# Patient Record
Sex: Female | Born: 2000 | Race: Black or African American | Hispanic: No | Marital: Single | Smoking: Never smoker
Health system: Southern US, Community
[De-identification: ages and names within clinical notes are randomized; demographics above are authoritative.]

## PROBLEM LIST (undated history)

## (undated) DIAGNOSIS — J45909 Unspecified asthma, uncomplicated: Secondary | ICD-10-CM

## (undated) HISTORY — PX: HERNIA REPAIR: SHX51

---
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
## 2020-04-25 ENCOUNTER — Institutional Professional Consult (permissible substitution): Payer: Medicaid Other | Admitting: Plastic Surgery

## 2020-07-18 ENCOUNTER — Other Ambulatory Visit: Payer: Self-pay | Admitting: Nurse Practitioner

## 2020-09-20 ENCOUNTER — Ambulatory Visit
Admission: EM | Admit: 2020-09-20 | Discharge: 2020-09-20 | Disposition: A | Discharge status: Home / Self Care | Payer: Medicaid Other | Attending: Physician Assistant | Admitting: Physician Assistant

## 2020-09-20 ENCOUNTER — Other Ambulatory Visit: Payer: Self-pay

## 2020-09-20 DIAGNOSIS — R0981 Nasal congestion: Secondary | ICD-10-CM

## 2020-09-20 DIAGNOSIS — R059 Cough, unspecified: Secondary | ICD-10-CM

## 2020-09-20 MED ORDER — IPRATROPIUM BROMIDE 0.06 % NA SOLN: 2.0000 | Freq: Four times a day (QID) | NASAL | 0 refills | Status: DC

## 2020-09-20 MED ORDER — FLUTICASONE PROPIONATE 50 MCG/ACT NA SUSP: 2.0000 | Freq: Every day | NASAL | 0 refills | Status: DC

## 2020-09-20 MED ORDER — AMOXICILLIN-POT CLAVULANATE 875-125 MG PO TABS: 1.0000 | ORAL_TABLET | Freq: Two times a day (BID) | ORAL | 0 refills | Status: DC

## 2020-09-20 MED ORDER — LIDOCAINE VISCOUS HCL 2 % MT SOLN: OROMUCOSAL | 0 refills | Status: DC

## 2020-09-20 NOTE — ED Provider Notes (Signed)
[facility]    CSN: [id number] Arrival date & time: 09/20/20  1355      History   Chief Complaint Chief Complaint  Patient presents with  . Cough    x 1 week  . Headache    x 1 week  . Sore Throat    x 1 week    HPI ALERIA MAHEU is a 19 y.o. female.   19 year old female comes in for 7 day of URI symptoms. Nasal congestion, productive cough. Intermittent headache with fatigue. Woke up this morning with sore throat. Denies fever, chills, body aches. Denies abdominal pain, nausea, vomiting, diarrhea. Denies shortness of breath, loss of taste/smell. Never smoker. otc cold medicines. Had asthma as a child.      Past Medical History:  Diagnosis Date  . Asthma     There are no problems to display for this patient.   Past Surgical History:  Procedure Laterality Date  . HERNIA REPAIR      OB History   No obstetric history on file.      Home Medications    Prior to Admission medications   Medication Sig Start Date End Date Taking? Authorizing Provider  amoxicillin-clavulanate (AUGMENTIN) 875-125 MG tablet Take 1 tablet by mouth every 12 (twelve) hours. 09/23/20   Cathie Hoops, Daquane Aguilar V, PA-C  fluticasone (FLONASE) 50 MCG/ACT nasal spray Place 2 sprays into both nostrils daily. 09/20/20   Cathie Hoops, Holley Kocurek V, PA-C  ipratropium (ATROVENT) 0.06 % nasal spray Place 2 sprays into both nostrils 4 (four) times daily. 09/20/20   Cathie Hoops, Dashauna Heymann V, PA-C  lidocaine (XYLOCAINE) 2 % solution 5-15 mL gargle as needed 09/20/20   Belinda Fisher, PA-C    Family History History reviewed. No pertinent family history.  Social History Social History   Tobacco Use  . Smoking status: Never Smoker  . Smokeless tobacco: Never Used  Vaping Use  . Vaping Use: Never used  Substance Use Topics  . Alcohol use: Yes  . Drug use: Never     Allergies   Patient has no known allergies.   Review of Systems Review of Systems  Reason unable to perform ROS: See HPI as above.     Physical  Exam Triage Vital Signs ED Triage Vitals  Enc Vitals Group     BP 09/20/20 1413 115/79     Pulse Rate 09/20/20 1413 88     Resp 09/20/20 1413 18     Temp 09/20/20 1414 98 F (36.7 C)     Temp Source 09/20/20 1413 Oral     SpO2 09/20/20 1413 97 %     Weight --      Height --      Head Circumference --      Peak Flow --      Pain Score 09/20/20 1414 8     Pain Loc --      Pain Edu? --      Excl. in GC? --    No data found.  Updated Vital Signs BP 115/79 (BP Location: Left Arm)   Pulse 88   Temp 98 F (36.7 C) (Oral)   Resp 18   SpO2 97%   Physical Exam Constitutional:      General: She is not in acute distress.    Appearance: Normal appearance. She is well-developed. She is not ill-appearing, toxic-appearing or diaphoretic.  HENT:     Head: Normocephalic and atraumatic.     Right Ear: Tympanic membrane, ear canal  and external ear normal. Tympanic membrane is not erythematous or bulging.     Left Ear: Tympanic membrane, ear canal and external ear normal. Tympanic membrane is not erythematous or bulging.     Nose:     Right Sinus: No maxillary sinus tenderness or frontal sinus tenderness.     Left Sinus: No maxillary sinus tenderness or frontal sinus tenderness.     Mouth/Throat:     Mouth: Mucous membranes are moist.     Pharynx: Oropharynx is clear. Uvula midline. Posterior oropharyngeal erythema present.  Eyes:     Conjunctiva/sclera: Conjunctivae normal.     Pupils: Pupils are equal, round, and reactive to light.  Cardiovascular:     Rate and Rhythm: Normal rate and regular rhythm.  Pulmonary:     Effort: Pulmonary effort is normal. No accessory muscle usage, prolonged expiration, respiratory distress or retractions.     Breath sounds: No decreased air movement or transmitted upper airway sounds. No decreased breath sounds.     Comments: LCTAB Musculoskeletal:     Cervical back: Normal range of motion and neck supple.  Skin:    General: Skin is warm and dry.   Neurological:     Mental Status: She is alert and oriented to person, place, and time.      UC Treatments / Results  Labs (all labs ordered are listed, but only abnormal results are displayed) Labs Reviewed  NOVEL CORONAVIRUS, NAA    EKG   Radiology No results found.  Procedures Procedures (including critical care time)  Medications Ordered in UC Medications - No data to display  Initial Impression / Assessment and Plan / UC Course  I have reviewed the triage vital signs and the nursing notes.  Pertinent labs & imaging results that were available during my care of the patient were reviewed by me and considered in my medical decision making (see chart for details).    COVID PCR test ordered. Patient to quarantine until testing results return. No alarming signs on exam. LCTAB. TM bilaterally without erythema, bulging. No sinus tenderness to palpation. Will start symptomatic treatment for now. However, given 7 day history of symptoms, Rx of augmentin called into pharmacy. If symptoms not improving in 3 days, can start to cover for bacterial sinusitis. Push fluids.  Return precautions given.  Patient expresses understanding and agrees to plan.  Final Clinical Impressions(s) / UC Diagnoses   Final diagnoses:  Nasal congestion  Cough    ED Prescriptions    Medication Sig Dispense Auth. Provider   fluticasone (FLONASE) 50 MCG/ACT nasal spray Place 2 sprays into both nostrils daily. 1 g Gerren Hoffmeier V, PA-C   ipratropium (ATROVENT) 0.06 % nasal spray Place 2 sprays into both nostrils 4 (four) times daily. 15 mL Ayven Glasco V, PA-C   lidocaine (XYLOCAINE) 2 % solution 5-15 mL gargle as needed 150 mL Kinsler Soeder V, PA-C   amoxicillin-clavulanate (AUGMENTIN) 875-125 MG tablet Take 1 tablet by mouth every 12 (twelve) hours. 14 tablet Belinda Fisher, PA-C     PDMP not reviewed this encounter.   Belinda Fisher, PA-C 09/20/20 1433

## 2020-09-20 NOTE — Discharge Instructions (Signed)
COVID PCR testing ordered. I would like you to quarantine until testing results. Start lidocaine for sore throat, do not eat or drink for the next 40 mins after use as it can stunt your gag reflex. Start flonase, atrovent nasal spray for nasal congestion/drainage. You can use over the counter nasal saline rinse such as neti pot for nasal congestion. Tylenol/motrin for pain and fever. Keep hydrated, urine should be clear to pale yellow in color. If experiencing shortness of breath, trouble breathing, go to the emergency department for further evaluation needed.   For sore throat/cough try using a honey-based tea. Use 3 teaspoons of honey with juice squeezed from half lemon. Place shaved pieces of ginger into 1/2-1 cup of water and warm over stove top. Then mix the ingredients and repeat every 4 hours as needed.  If symptoms not improving in 3 days, can fill augmentin to cover for sinus infection

## 2020-09-20 NOTE — ED Triage Notes (Signed)
Pt states that she has had a cough and congestion x 1 week as well as intermittent headaches and some fatigue. Pt is aox4 and ambulatory.

## 2020-09-21 LAB — NOVEL CORONAVIRUS, NAA: SARS-CoV-2, NAA: NOT DETECTED

## 2020-09-21 LAB — SARS-COV-2, NAA 2 DAY TAT

## 2021-01-02 ENCOUNTER — Other Ambulatory Visit: Payer: Self-pay

## 2021-01-02 ENCOUNTER — Ambulatory Visit (INDEPENDENT_AMBULATORY_CARE_PROVIDER_SITE_OTHER): Payer: Medicaid Other | Admitting: Plastic Surgery

## 2021-01-02 ENCOUNTER — Encounter: Payer: Self-pay | Admitting: Plastic Surgery

## 2021-01-02 DIAGNOSIS — M546 Pain in thoracic spine: Secondary | ICD-10-CM | POA: Diagnosis not present

## 2021-01-02 DIAGNOSIS — G8929 Other chronic pain: Secondary | ICD-10-CM

## 2021-01-02 DIAGNOSIS — M542 Cervicalgia: Secondary | ICD-10-CM | POA: Diagnosis not present

## 2021-01-02 DIAGNOSIS — M549 Dorsalgia, unspecified: Secondary | ICD-10-CM | POA: Insufficient documentation

## 2021-01-02 DIAGNOSIS — N62 Hypertrophy of breast: Secondary | ICD-10-CM

## 2021-01-02 NOTE — Progress Notes (Signed)
Patient ID: Wendy Benitez, female    DOB: 10-03-2001, 20 y.o.   MRN: [id number]   Chief Complaint  Patient presents with  . Advice Only  . Breast Problem    Mammary Hyperplasia: The patient is a 20 y.o. female with a history of mammary hyperplasia for several years.  She has extremely large breasts causing symptoms that include the following: Back pain in the upper and lower back, including neck pain. She pulls or pins her bra straps to provide better lift and relief of the pressure and pain. She notices relief by holding her breast up manually.  Her shoulder straps cause grooves and pain and pressure that requires padding for relief. Pain medication is sometimes required with motrin and tylenol.  Activities that are hindered by enlarged breasts include: exercise and running.  She has tried supportive clothing as well as fitted bras without improvement.  Her breasts are extremely large and fairly symmetric.  She has hyperpigmentation of the inframammary area on both sides.  The sternal to nipple distance on the right is 42 cm and the left is 42 cm.  The IMF distance is 15 cm.  She is 5 feet 5 inches tall and weighs 160 pounds.  Preoperative bra size = DDD/E cup.  She would like to be a C cup or smaller.  The estimated excess breast tissue to be removed at the time of surgery = 440 grams on the left and 440 grams on the right.  Mammogram history: None but is willing to have 1.  Family history of breast cancer: Positive in a maternal aunt.  Tobacco use: None.  She has not had physical therapy or chiropractics.   Review of Systems  Constitutional: Negative for activity change and appetite change.  Eyes: Negative.   Respiratory: Negative.  Negative for chest tightness and shortness of breath.   Cardiovascular: Negative.  Negative for leg swelling.  Gastrointestinal: Negative for abdominal distention and abdominal pain.  Endocrine: Negative.   Genitourinary: Negative.   Musculoskeletal:  Positive for back pain and neck pain.  Neurological: Negative.   Hematological: Negative.   Psychiatric/Behavioral: Negative.     Past Medical History:  Diagnosis Date  . Asthma     Past Surgical History:  Procedure Laterality Date  . HERNIA REPAIR        Current Outpatient Medications:  .  amoxicillin-clavulanate (AUGMENTIN) 875-125 MG tablet, Take 1 tablet by mouth every 12 (twelve) hours. (Patient not taking: Reported on 01/02/2021), Disp: 14 tablet, Rfl: 0 .  fluticasone (FLONASE) 50 MCG/ACT nasal spray, Place 2 sprays into both nostrils daily. (Patient not taking: Reported on 01/02/2021), Disp: 1 g, Rfl: 0 .  ipratropium (ATROVENT) 0.06 % nasal spray, Place 2 sprays into both nostrils 4 (four) times daily. (Patient not taking: Reported on 01/02/2021), Disp: 15 mL, Rfl: 0 .  lidocaine (XYLOCAINE) 2 % solution, 5-15 mL gargle as needed (Patient not taking: Reported on 01/02/2021), Disp: 150 mL, Rfl: 0   Objective:   Vitals:   01/02/21 0853  BP: 137/84  Pulse: 88  SpO2: 90%    Physical Exam Vitals and nursing note reviewed.  Constitutional:      Appearance: Normal appearance.  HENT:     Head: Normocephalic and atraumatic.  Cardiovascular:     Rate and Rhythm: Normal rate.     Pulses: Normal pulses.  Pulmonary:     Effort: Pulmonary effort is normal. No respiratory distress.  Abdominal:     General:  Abdomen is flat. There is no distension.     Tenderness: There is no abdominal tenderness.  Neurological:     General: No focal deficit present.     Mental Status: She is alert and oriented to person, place, and time.  Psychiatric:        Mood and Affect: Mood normal.        Behavior: Behavior normal.        Thought Content: Thought content normal.        Judgment: Judgment normal.     Assessment & Plan:  Symptomatic mammary hypertrophy  Chronic bilateral thoracic back pain  Neck pain  The patient is a very good candidate for bilateral breast reduction with  possible liposuction.  She is aware that the nipple areola sensation can change after surgery and become more or less sensitive we also discussed this hours.  She is willing to go for physical therapy and a mammogram.  We will get both of those set up for her.  We will also email her the brochure.  She knows to call us when she has finished with physical therapy.  Pictures were obtained of the patient and placed in the chart with the patient's or guardian's permission.  [name] Arlene Genova, DO

## 2021-01-03 ENCOUNTER — Other Ambulatory Visit: Payer: Self-pay | Admitting: Plastic Surgery

## 2021-01-03 DIAGNOSIS — M542 Cervicalgia: Secondary | ICD-10-CM

## 2021-01-03 DIAGNOSIS — G8929 Other chronic pain: Secondary | ICD-10-CM

## 2021-01-03 DIAGNOSIS — N62 Hypertrophy of breast: Secondary | ICD-10-CM

## 2021-01-24 ENCOUNTER — Other Ambulatory Visit: Payer: Self-pay

## 2021-01-24 ENCOUNTER — Ambulatory Visit: Payer: Medicaid Other | Attending: Plastic Surgery | Admitting: Physical Therapy

## 2021-01-24 ENCOUNTER — Encounter: Payer: Self-pay | Admitting: Physical Therapy

## 2021-01-24 DIAGNOSIS — M546 Pain in thoracic spine: Secondary | ICD-10-CM | POA: Diagnosis present

## 2021-01-24 DIAGNOSIS — R293 Abnormal posture: Secondary | ICD-10-CM | POA: Insufficient documentation

## 2021-01-24 NOTE — Therapy (Signed)
[facility] [address] [place], [place], [address] Phone: [phone]   Fax:  [phone]  Physical Therapy Evaluation  Patient Details  Name: Wendy Benitez MRN: [id number] Date of Birth: 2000/11/22 Referring Provider (PT): Foster Simpson, DO   Encounter Date: 01/24/2021   PT End of Session - 01/24/21 1637    Visit Number 1    Number of Visits 6    Date for PT Re-Evaluation 03/07/21    PT Start Time 1000    PT Stop Time 1040    PT Time Calculation (min) 40 min    Activity Tolerance Patient tolerated treatment well    Behavior During Therapy [facility] for tasks assessed/performed           Past Medical History:  Diagnosis Date  . Asthma     Past Surgical History:  Procedure Laterality Date  . HERNIA REPAIR      There were no vitals filed for this visit.    Subjective Assessment - 01/24/21 1010    Subjective Patient here for mid back and upper back which MD thinks her breast size may be a factor.  She has increased pain with work activities including lifting and bending.  She has pain when sitting unsupported.  She has to work hard to sit up straight.  She started having increased pain for the past year, has gained some weight. She denies pain in arms or legs, radiating pain or sensory disturbance.    Pertinent History none    Limitations Lifting;Standing;House hold activities    Diagnostic tests none    Patient Stated Goals Pain relief, be healthier    Currently in Pain? Yes    Pain Score 5     Pain Location Back    Pain Orientation Mid;Upper    Pain Descriptors / Indicators Sore;Aching    Pain Type Chronic pain    Pain Onset More than a month ago    Pain Frequency Intermittent    Aggravating Factors  bending, lifting    Pain Relieving Factors laying down, Ibuprofen, wearing 2 bras    Effect of Pain on Daily Activities hard to exercise or do cardio, work    Multiple Pain Sites No              [facility] PT  Assessment - 01/24/21 0001      Assessment   Medical Diagnosis symptomatic mammary hypertrophy, thoracic pain    Referring Provider (PT) Foster Simpson, DO    Onset Date/Surgical Date --   chronic, yrs   Hand Dominance Right    Prior Therapy No      Precautions   Precautions None      Restrictions   Weight Bearing Restrictions No      Balance Screen   Has the patient fallen in the past 6 months No      Home Environment   Living Environment Private residence    Living Arrangements Spouse/significant other    Type of Home Apartment      Prior Function   Level of Independence Independent    Vocation Part time employment;Student    Vocation Requirements bending, lifting, standing (cafe)    Leisure dance classes, work, school      Cognition   Overall Cognitive Status Within Functional Limits for tasks assessed      Observation/Other Assessments   Focus on Therapeutic Outcomes (FOTO)  NT due to MCD , diagnosis      Sensation   Light  Touch Appears Intact      Coordination   Gross Motor Movements are Fluid and Coordinated Not tested      Posture/Postural Control   Posture/Postural Control Postural limitations    Postural Limitations Decreased thoracic kyphosis    Posture Comments large breast tissue with ill-fitting bra, indentions at top of shoulder      AROM   Overall AROM Comments WFL good lumbar and thoracic      Strength   Overall Strength Comments WFL in shoulders, shoulder ext 4/5, horizontal abd 4/5      Palpation   Spinal mobility good mobility in lumbar and relative stiffness in thoracic spine    Palpation comment painful in rhomboids and thoracic paraspinals                      Objective measurements completed on examination: See above findings.       [facility] Adult PT Treatment/Exercise - 01/24/21 0001      Self-Care   Self-Care Other Self-Care Comments;Posture    Posture postural imbalance, cervical and back strain    Other  Self-Care Comments  resource for bra fitting , HEP      Shoulder Exercises: ROM/Strengthening   Other ROM/Strengthening Exercises cat /camel for thoracic and lumbar spinal mobility      Shoulder Exercises: Stretch   Corner Stretch 3 reps;30 seconds    Other Shoulder Stretches upper trunk rotation x 5 each side                  PT Education - 01/24/21 1636    Education Details PT/POC, posture and proper fitting    Person(s) Educated Patient    Methods Explanation;Handout    Comprehension Verbalized understanding               PT Long Term Goals - 01/24/21 1638      PT LONG TERM GOAL #1   Title Pt will be I with HEP for mid and upper back strength, mobility    Baseline given on eval    Time 6    Period Weeks    Status New    Target Date 03/07/21      PT LONG TERM GOAL #2   Title Pt will be able to sit with improved awareness of posture and make changes to correct when able.    Baseline is often not aware she is slumping    Time 6    Period Weeks    Status New    Target Date 03/07/21      PT LONG TERM GOAL #3   Title Pt will demo good squat, lift mechanics for safety at work, lifting 20 lbs from floor to waist height    Baseline not educated on this, often bends back to do this    Time 6    Period Weeks    Status New    Target Date 03/07/21                  Plan - 01/24/21 1641    Clinical Impression Statement Pt presents for low complexity eval of symptomatic mammary hypertrophy and related strain on middle and upper back.  Her pain was centralized in thoracic spine and she showed poor sitting posture unless cued.  She was given exercises to begin to improve her postural awareness and motor control of her spine. Tension in shoulders address as well anad she responded favorably to this.  She has strength  WFL but could benefit from strengthening upper back to support spine and chest. Mainly she complains of difficulty working and being able to lift,  squat and bed.  She is not as active as she should be due to discomfort in her back. She should expect to feel a bit better with exercise but will likely also benefit from breast reduction for symptom management under DO recommendations.    Personal Factors and Comorbidities Age;Time since onset of injury/illness/exacerbation    Examination-Activity Limitations Squat;Sit;Carry;Bend;Lift    Examination-Participation Restrictions School;Community Activity;Occupation    Stability/Clinical Decision Making Stable/Uncomplicated    Clinical Decision Making Low    Rehab Potential Excellent    PT Frequency 1x / week    PT Duration 6 weeks    PT Treatment/Interventions ADLs/Self Care Home Management;Moist Heat;Manual techniques;Patient/family education;Therapeutic exercise;Taping    PT Next Visit Plan check HEP add in standing scapular strength, posture, lifting vs bending    PT Home Exercise Plan Access Code: [email or web address]  URL: [email or web address]  Date: 01/24/2021  Prepared by: Karie Mainland    Exercises  Cat-Camel to Child's Pose - 1-2 x daily - 7 x weekly - 2 sets - 10 reps - 5 hold  Sidelying Thoracic Rotation with Open Book - 2 x daily - 7 x weekly - 2 sets - 10 reps - 10 hold  Corner Pec Major Stretch - 2 x daily - 7 x weekly - 1 sets - 3-5 reps - 30 hold    Consulted and Agree with Plan of Care Patient           Patient will benefit from skilled therapeutic intervention in order to improve the following deficits and impairments:  Decreased mobility,Pain,Postural dysfunction,Impaired UE functional use,Increased fascial restricitons,Decreased strength,Hypomobility  Visit Diagnosis: Abnormal posture  Pain in thoracic spine     Problem List Patient Active Problem List   Diagnosis Date Noted  . 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 Back pain 01/02/2021  . Neck pain 01/02/2021    Liyana Suniga 01/24/2021, 4:48 PM  [facility] [address] [place], [place], [address] Phone: [phone]   Fax:  [phone]  Name: KYRIANNA BARLETTA MRN: [id number] Date of Birth: [birth year]   Check all possible CPT codes: 97110- Therapeutic Exercise, [phone]- Neuro Re-education, [phone] - Manual Therapy, 97530 - Therapeutic Activities, [phone] - Self Care and [phone] - Electrical stimulation (unattended)        Karie Mainland, PT 01/24/21 4:48 PM Phone: [phone] Fax: [phone]

## 2021-01-24 NOTE — Patient Instructions (Signed)
Access Code: [email or web address] URL: [email or web address] Date: 01/24/2021 Prepared by: Karie Mainland  Exercises Cat-Camel to Child's Pose - 1-2 x daily - 7 x weekly - 2 sets - 10 reps - 5 hold Sidelying Thoracic Rotation with Open Book - 2 x daily - 7 x weekly - 2 sets - 10 reps - 10 hold Corner Pec Major Stretch - 2 x daily - 7 x weekly - 1 sets - 3-5 reps - 30 hold

## 2021-01-29 ENCOUNTER — Other Ambulatory Visit: Payer: Self-pay | Admitting: Surgical

## 2021-01-29 DIAGNOSIS — M546 Pain in thoracic spine: Secondary | ICD-10-CM

## 2021-01-29 DIAGNOSIS — M542 Cervicalgia: Secondary | ICD-10-CM

## 2021-01-29 DIAGNOSIS — N62 Hypertrophy of breast: Secondary | ICD-10-CM

## 2021-01-29 DIAGNOSIS — G8929 Other chronic pain: Secondary | ICD-10-CM

## 2021-02-08 ENCOUNTER — Ambulatory Visit: Payer: Medicaid Other | Admitting: Physical Therapy

## 2021-02-08 ENCOUNTER — Encounter: Payer: Self-pay | Admitting: Physical Therapy

## 2021-02-08 ENCOUNTER — Ambulatory Visit: Payer: Medicaid Other

## 2021-02-08 ENCOUNTER — Other Ambulatory Visit: Payer: Self-pay

## 2021-02-08 DIAGNOSIS — R293 Abnormal posture: Secondary | ICD-10-CM | POA: Diagnosis not present

## 2021-02-08 DIAGNOSIS — M546 Pain in thoracic spine: Secondary | ICD-10-CM

## 2021-02-08 NOTE — Therapy (Signed)
[facility] Outpatient Rehabilitation [facility] [address] [place], [place], [address] Phone: [phone]   Fax:  [phone]  Physical Therapy Treatment  Patient Details  Name: Wendy Benitez MRN: [id number] Date of Birth: 08/18/2001 Referring Provider (PT): Foster Simpson, DO   Encounter Date: 02/08/2021   PT End of Session - 02/08/21 1027    Visit Number 2    Number of Visits 6    Date for PT Re-Evaluation 03/07/21    PT Start Time 0944   14 minutes late   PT Stop Time 1013    PT Time Calculation (min) 29 min           Past Medical History:  Diagnosis Date  . Asthma     Past Surgical History:  Procedure Laterality Date  . HERNIA REPAIR      There were no vitals filed for this visit.   Subjective Assessment - 02/08/21 0945    Subjective I have been ding the exercises. I can tell a difference. I have no pain right now.    Currently in Pain? No/denies                             [facility] Adult PT Treatment/Exercise - 02/08/21 0001      Shoulder Exercises: Prone   Other Prone Exercises qped bird dogs      Shoulder Exercises: Standing   Horizontal ABduction 20 reps    Theraband Level (Shoulder Horizontal ABduction) Level 3 (Green)    External Rotation 10 reps   2 sets   Theraband Level (Shoulder External Rotation) Level 3 (Green)      Shoulder Exercises: ROM/Strengthening   UBE (Upper Arm Bike) L2 3 min each way    Other ROM/Strengthening Exercises cat /camel for thoracic and lumbar spinal mobility      Shoulder Exercises: Stretch   Corner Stretch 3 reps;30 seconds    Other Shoulder Stretches upper trunk rotation x 10 each side    Other Shoulder Stretches thoracic extension over yoga mat roll with UE raises to over head x 10                       PT Long Term Goals - 01/24/21 1638      PT LONG TERM GOAL #1   Title Pt will be I with HEP for mid and upper back strength, mobility    Baseline given on  eval    Time 6    Period Weeks    Status New    Target Date 03/07/21      PT LONG TERM GOAL #2   Title Pt will be able to sit with improved awareness of posture and make changes to correct when able.    Baseline is often not aware she is slumping    Time 6    Period Weeks    Status New    Target Date 03/07/21      PT LONG TERM GOAL #3   Title Pt will demo good squat, lift mechanics for safety at work, lifting 20 lbs from floor to waist height    Baseline not educated on this, often bends back to do this    Time 6    Period Weeks    Status New    Target Date 03/07/21                 Plan - 02/08/21 1019  Clinical Impression Statement Pt arrives late today. She reports compliance with HEP and notes improvement in pain. Today she had right shoulder pain with upper trunk rotations. Progressed with theraband postural strengthening and updated HEP. Began thoracic extension stretch with good tolerance.    PT Next Visit Plan check HEP add in standing scapular strength, posture, lifting vs bending    PT Home Exercise Plan Access Code: [email or web address]  URL: [email or web address]  Date: 02/08/2021  Prepared by: Jannette Spanner    Exercises  Cat-Camel to Child's Pose - 1-2 x daily - 7 x weekly - 2 sets - 10 reps - 5 hold  Sidelying Thoracic Rotation with Open Book - 2 x daily - 7 x weekly - 2 sets - 10 reps - 10 hold  Corner Pec Major Stretch - 2 x daily - 7 x weekly - 1 sets - 3-5 reps - 30 hold  Standing Shoulder Horizontal Abduction with Resistance - 2 x daily - 7 x weekly - 2 sets - 10 reps  Shoulder W - External Rotation with Resistance - 2 x daily - 7 x weekly - 2 sets - 10 reps    Consulted and Agree with Plan of Care Patient           Patient will benefit from skilled therapeutic intervention in order to improve the following deficits and impairments:  Decreased mobility,Pain,Postural dysfunction,Impaired UE functional use,Increased fascial restricitons,Decreased  strength,Hypomobility  Visit Diagnosis: Abnormal posture  Pain in thoracic spine     Problem List Patient Active Problem List   Diagnosis Date Noted  . 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 Back pain 01/02/2021  . Neck pain 01/02/2021    Sherrie Mustache, [place] 02/08/2021, 10:30 AM  [facility] [address] [place], [place], [address] Phone: [phone]   Fax:  [phone]  Name: Wendy Benitez MRN: [id number] Date of Birth: [birth year]

## 2021-02-12 ENCOUNTER — Other Ambulatory Visit: Payer: Self-pay

## 2021-02-12 ENCOUNTER — Ambulatory Visit: Payer: Medicaid Other | Attending: Plastic Surgery

## 2021-02-12 DIAGNOSIS — M546 Pain in thoracic spine: Secondary | ICD-10-CM | POA: Insufficient documentation

## 2021-02-12 DIAGNOSIS — R293 Abnormal posture: Secondary | ICD-10-CM | POA: Diagnosis not present

## 2021-02-12 NOTE — Therapy (Signed)
[facility] Outpatient Rehabilitation [facility] [address] [place], [place], [address] Phone: [phone]   Fax:  [phone]  Physical Therapy Treatment  Patient Details  Name: Wendy Benitez MRN: [id number] Date of Birth: [birth year] Referring Provider (PT): Foster Simpson, DO   Encounter Date: 02/12/2021   PT End of Session - 02/12/21 0924    Visit Number 3    Number of Visits 6    Date for PT Re-Evaluation 03/07/21    PT Start Time 0924    PT Stop Time 1002    PT Time Calculation (min) 38 min    Activity Tolerance Patient tolerated treatment well;No increased pain    Behavior During Therapy WFL for tasks assessed/performed           Past Medical History:  Diagnosis Date  . Asthma     Past Surgical History:  Procedure Laterality Date  . HERNIA REPAIR      There were no vitals filed for this visit.   Subjective Assessment - 02/12/21 0924    Subjective Pt presents to PT with no current reports of pain or discomfort. She has been compliant with her HEP with no adverse effect. Pt is ready to begin PT treatment at this time.    Currently in Pain? No/denies    Pain Score 0-No pain                             [facility] Adult PT Treatment/Exercise - 02/12/21 0001      Shoulder Exercises: Prone   Other Prone Exercises qped thread the needle x 5 w/ retraction    Other Prone Exercises qped bird dogs 2 x 5 ea      Shoulder Exercises: Standing   Horizontal ABduction 20 reps;Both    Theraband Level (Shoulder Horizontal ABduction) Level 3 (Green)    External Rotation 10 reps   2 sets   Theraband Level (Shoulder External Rotation) Level 3 (Green)    Extension 10 reps   2 reps; black TB - w/ scap retraction   Row 15 reps   2 sets   Theraband Level (Shoulder Row) --   black     Shoulder Exercises: ROM/Strengthening   UBE (Upper Arm Bike) L2 x 5 mine (2.5 min ea way) while taking subjective    Other ROM/Strengthening Exercises thoracic  extension over half foam x 15 in supine    Other ROM/Strengthening Exercises cat /camel for thoracic and lumbar spinal mobility x 10 ea      Shoulder Exercises: Stretch   Corner Stretch 3 reps;30 seconds    Other Shoulder Stretches fwd swiss ball rollouts with wide UE placement    Other Shoulder Stretches thoracic extension over half foam x 15 in supine                 PT Long Term Goals - 01/24/21 1638      PT LONG TERM GOAL #1   Title Pt will be I with HEP for mid and upper back strength, mobility    Baseline given on eval    Time 6    Period Weeks    Status New    Target Date 03/07/21      PT LONG TERM GOAL #2   Title Pt will be able to sit with improved awareness of posture and make changes to correct when able.    Baseline is often not aware she is slumping  Time 6    Period Weeks    Status New    Target Date 03/07/21      PT LONG TERM GOAL #3   Title Pt will demo good squat, lift mechanics for safety at work, lifting 20 lbs from floor to waist height    Baseline not educated on this, often bends back to do this    Time 6    Period Weeks    Status New    Target Date 03/07/21                 Plan - 02/12/21 1007    Clinical Impression Statement Pt was able to complete all prescribed therapeutic exercises with no adverse effect or increase in pain. She continues to respond well to anterior chest wall stretching, thoracic AROM, and periscpular strengthening. PT will continue to progress therapeutic exercises as tolerated per POC as prescribed.    PT Treatment/Interventions ADLs/Self Care Home Management;Moist Heat;Manual techniques;Patient/family education;Therapeutic exercise;Taping    PT Next Visit Plan continue to progress scapular strengthening and anterior chest wall stretches as able    PT Home Exercise Plan Access Code: [email or web address]  URL: [email or web address]  Date: 02/08/2021  Prepared by: Jannette Spanner    Exercises  Cat-Camel to Child's  Pose - 1-2 x daily - 7 x weekly - 2 sets - 10 reps - 5 hold  Sidelying Thoracic Rotation with Open Book - 2 x daily - 7 x weekly - 2 sets - 10 reps - 10 hold  Corner Pec Major Stretch - 2 x daily - 7 x weekly - 1 sets - 3-5 reps - 30 hold  Standing Shoulder Horizontal Abduction with Resistance - 2 x daily - 7 x weekly - 2 sets - 10 reps  Shoulder W - External Rotation with Resistance - 2 x daily - 7 x weekly - 2 sets - 10 reps    Consulted and Agree with Plan of Care Patient           Patient will benefit from skilled therapeutic intervention in order to improve the following deficits and impairments:  Decreased mobility,Pain,Postural dysfunction,Impaired UE functional use,Increased fascial restricitons,Decreased strength,Hypomobility  Visit Diagnosis: Abnormal posture  Pain in thoracic spine     Problem List Patient Active Problem List   Diagnosis Date Noted  . 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 Back pain 01/02/2021  . Neck pain 01/02/2021    Eloy End, PT, DPT 02/12/21 10:11 AM  [facility] [address] [place], [place], [address] Phone: [phone]   Fax:  [phone]  Name: Wendy Benitez MRN: [id number] Date of Birth: 01/19/2001

## 2021-02-19 ENCOUNTER — Ambulatory Visit: Payer: Medicaid Other

## 2021-02-19 ENCOUNTER — Other Ambulatory Visit: Payer: Self-pay

## 2021-02-19 DIAGNOSIS — R293 Abnormal posture: Secondary | ICD-10-CM | POA: Diagnosis not present

## 2021-02-19 DIAGNOSIS — M546 Pain in thoracic spine: Secondary | ICD-10-CM

## 2021-02-19 NOTE — Therapy (Signed)
[facility] Outpatient [facility] [address] [place], [place], [address] Phone: [phone]   Fax:  [phone]  Physical Therapy Treatment  Patient Details  Name: Wendy Benitez MRN: [id number] Date of Birth: 11-Jul-2001 Referring Provider (PT): Foster Simpson, DO   Encounter Date: 02/19/2021   PT End of Session - 02/19/21 0923    Visit Number 4    Number of Visits 6    Date for PT Re-Evaluation 03/07/21    PT Start Time 0923    PT Stop Time 1001    PT Time Calculation (min) 38 min           Past Medical History:  Diagnosis Date  . Asthma     Past Surgical History:  Procedure Laterality Date  . HERNIA REPAIR      There were no vitals filed for this visit.   Subjective Assessment - 02/19/21 0925    Subjective Pt presents to PT with no current reports of pain or discomfort. She has continued to be compliant with her HEP with no adverse effect. Pt is ready to begin PT treatment at this time.    Currently in Pain? No/denies    Pain Score 0-No pain                             [facility] Adult PT Treatment/Exercise - 02/19/21 0001      Shoulder Exercises: Supine   Other Supine Exercises thoracic extension over soft foam x 15      Shoulder Exercises: Seated   Horizontal ABduction 15 reps;Theraband   2 sets   Theraband Level (Shoulder Horizontal ABduction) Level 3 (Green)    External Rotation Both;10 reps;Theraband   2 sets   Flexion 10 reps   2 sets   Flexion Weight (lbs) 3    Other Seated Exercises overhead press 2x10 7lbs    Other Seated Exercises fwd ball rollouts x 10 - emphasis on thoracic ext      Shoulder Exercises: Prone   Other Prone Exercises qped cat cow 2 x 10      Shoulder Exercises: Standing   Extension 10 reps;Both   2 sets   Extension Weight (lbs) 10    Row 15 reps;Theraband   3 sets   Theraband Level (Shoulder Row) Level 4 (Blue)      Shoulder Exercises: Stretch   Corner Stretch 2 reps;30  seconds                       PT Long Term Goals - 01/24/21 1638      PT LONG TERM GOAL #1   Title Pt will be I with HEP for mid and upper back strength, mobility    Baseline given on eval    Time 6    Period Weeks    Status New    Target Date 03/07/21      PT LONG TERM GOAL #2   Title Pt will be able to sit with improved awareness of posture and make changes to correct when able.    Baseline is often not aware she is slumping    Time 6    Period Weeks    Status New    Target Date 03/07/21      PT LONG TERM GOAL #3   Title Pt will demo good squat, lift mechanics for safety at work, lifting 20 lbs from floor to waist height  Baseline not educated on this, often bends back to do this    Time 6    Period Weeks    Status New    Target Date 03/07/21                 Plan - 02/19/21 0955    Clinical Impression Statement Pt was once again able to complete all prescribed exercises wtih no adverse effect or increase in pain. She continues to respond wel to periscapular strenthening and thoracic mobility exercises. Pt is progressing well with therapy and should continue to be seen and progress per POC as prescribed.    PT Treatment/Interventions ADLs/Self Care Home Management;Moist Heat;Manual techniques;Patient/family education;Therapeutic exercise;Taping    PT Next Visit Plan continue to progress scapular strengthening and anterior chest wall stretches as able    PT Home Exercise Plan Access Code: [email or web address]  URL: [email or web address]  Date: 02/08/2021  Prepared by: Jannette Spanner    Exercises  Cat-Camel to Child's Pose - 1-2 x daily - 7 x weekly - 2 sets - 10 reps - 5 hold  Sidelying Thoracic Rotation with Open Book - 2 x daily - 7 x weekly - 2 sets - 10 reps - 10 hold  Corner Pec Major Stretch - 2 x daily - 7 x weekly - 1 sets - 3-5 reps - 30 hold  Standing Shoulder Horizontal Abduction with Resistance - 2 x daily - 7 x weekly - 2 sets - 10 reps   Shoulder W - External Rotation with Resistance - 2 x daily - 7 x weekly - 2 sets - 10 reps           Patient will benefit from skilled therapeutic intervention in order to improve the following deficits and impairments:  Decreased mobility,Pain,Postural dysfunction,Impaired UE functional use,Increased fascial restricitons,Decreased strength,Hypomobility  Visit Diagnosis: Abnormal posture  Pain in thoracic spine     Problem List Patient Active Problem List   Diagnosis Date Noted  . 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 Back pain 01/02/2021  . Neck pain 01/02/2021    Eloy End, PT, DPT 02/19/21 10:02 AM  [facility] Out[facility] [address] [place], [place], [address] Phone: [phone]   Fax:  [phone]  Name: Wendy Benitez MRN: [id number] Date of Birth: 2001/08/17

## 2021-02-27 ENCOUNTER — Ambulatory Visit: Payer: Medicaid Other | Admitting: Physical Therapy

## 2021-02-27 ENCOUNTER — Encounter: Payer: Self-pay | Admitting: Physical Therapy

## 2021-02-27 ENCOUNTER — Other Ambulatory Visit: Payer: Self-pay

## 2021-02-27 DIAGNOSIS — M546 Pain in thoracic spine: Secondary | ICD-10-CM

## 2021-02-27 DIAGNOSIS — R293 Abnormal posture: Secondary | ICD-10-CM | POA: Diagnosis not present

## 2021-02-27 NOTE — Therapy (Signed)
[facility] Outpatient Rehabilitation [facility] [address] [place], [place], [address] Phone: [phone]   Fax:  [phone]  Physical Therapy Treatment  Patient Details  Name: Wendy Benitez MRN: [id number] Date of Birth: 2000/11/30 Referring Provider (PT): Foster Simpson, DO   Encounter Date: 02/27/2021   PT End of Session - 02/27/21 0927    Visit Number 5    Number of Visits 6    Date for PT Re-Evaluation 03/07/21    PT Start Time 0926    PT Stop Time 1002    PT Time Calculation (min) 36 min    Activity Tolerance Patient tolerated treatment well;No increased pain    Behavior During Therapy WFL for tasks assessed/performed           Past Medical History:  Diagnosis Date  . Asthma     Past Surgical History:  Procedure Laterality Date  . HERNIA REPAIR      There were no vitals filed for this visit.   Subjective Assessment - 02/27/21 0930    Subjective No pain this AM.  Work has been going OK.  Does her exercises.    Currently in Pain? No/denies                [facility] Adult PT Treatment/Exercise - 02/27/21 0001      Shoulder Exercises: Supine   Protraction Strengthening;Both;10 reps    Horizontal ABduction Strengthening;Both;10 reps    Flexion 10 reps;Both    Shoulder Flexion Weight (lbs) alternating arms on foam roller then narrow grip over head x 10    Other Supine Exercises thoracic extension over soft foam x 5 different levels      Shoulder Exercises: Standing   Horizontal ABduction 20 reps;Both    Theraband Level (Shoulder Horizontal ABduction) Level 4 (Blue)    Horizontal ABduction Weight (lbs) on wall    External Rotation 10 reps   2 sets   Theraband Level (Shoulder External Rotation) Level 4 (Blue)    External Rotation Weight (lbs) on wall    Other Standing Exercises goal post arms, to high V      Shoulder Exercises: ROM/Strengthening   UBE (Upper Arm Bike) 6 min L1 , 3 min each direction                        PT Long Term Goals - 02/27/21 1038      PT LONG TERM GOAL #1   Title Pt will be I with HEP for mid and upper back strength, mobility    Status On-going      PT LONG TERM GOAL #2   Title Pt will be able to sit with improved awareness of posture and make changes to correct when able.    Status On-going      PT LONG TERM GOAL #3   Title Pt will demo good squat, lift mechanics for safety at work, lifting 20 lbs from floor to waist height    Status On-going                 Plan - 02/27/21 0932    Clinical Impression Statement Patient is doing well with her exercises.  Asked her to try to take her current HEP to standing on a wall to check posture and added a blue band for more resistance.  She has 2 more visits and then she will be seen back at [facility].    PT Treatment/Interventions ADLs/Self Care Home Management;Moist Heat;Manual  techniques;Patient/family education;Therapeutic exercise;Taping    PT Next Visit Plan continue to progress scapular strengthening and anterior chest wall stretches as able    PT Home Exercise Plan Access Code: [email or web address]  URL: [email or web address]  Date: 02/08/2021  Prepared by: Jannette Spanner    Exercises  Cat-Camel to Child's Pose - 1-2 x daily - 7 x weekly - 2 sets - 10 reps - 5 hold  Sidelying Thoracic Rotation with Open Book - 2 x daily - 7 x weekly - 2 sets - 10 reps - 10 hold  Corner Pec Major Stretch - 2 x daily - 7 x weekly - 1 sets - 3-5 reps - 30 hold  Standing Shoulder Horizontal Abduction with Resistance - 2 x daily - 7 x weekly - 2 sets - 10 reps  Shoulder W - External Rotation with Resistance - 2 x daily - 7 x weekly - 2 sets - 10 reps    Consulted and Agree with Plan of Care Patient           Patient will benefit from skilled therapeutic intervention in order to improve the following deficits and impairments:  Decreased mobility,Pain,Postural dysfunction,Impaired UE functional use,Increased fascial  restricitons,Decreased strength,Hypomobility  Visit Diagnosis: Abnormal posture  Pain in thoracic spine     Problem List Patient Active Problem List   Diagnosis Date Noted  . 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 Back pain 01/02/2021  . Neck pain 01/02/2021    Wendy Benitez 02/27/2021, 10:51 AM  [facility] [address] [place], [place], [address] Phone: [phone]   Fax:  [phone]  Name: Wendy Benitez MRN: [id number] Date of Birth: 09-24-2001  Karie Mainland, PT 02/27/21 10:51 AM Phone: [phone] Fax: [phone]

## 2021-03-02 ENCOUNTER — Other Ambulatory Visit: Payer: Self-pay

## 2021-03-02 ENCOUNTER — Ambulatory Visit
Admission: EM | Admit: 2021-03-02 | Discharge: 2021-03-02 | Discharge status: Against Medical Advice | Payer: Medicaid Other

## 2021-03-02 NOTE — ED Notes (Signed)
No answer x2 

## 2021-03-02 NOTE — ED Notes (Signed)
No answer in waiting room or phone

## 2021-03-05 ENCOUNTER — Other Ambulatory Visit: Payer: Self-pay

## 2021-03-05 ENCOUNTER — Ambulatory Visit: Payer: Medicaid Other | Admitting: Physical Therapy

## 2021-03-05 DIAGNOSIS — R293 Abnormal posture: Secondary | ICD-10-CM | POA: Diagnosis not present

## 2021-03-05 DIAGNOSIS — M546 Pain in thoracic spine: Secondary | ICD-10-CM

## 2021-03-05 NOTE — Therapy (Signed)
[place], [place], [address] Phone: [phone]   Fax:  [phone]  Physical Therapy Treatment/DIscharge  Patient Details  Name: MALCOLM HETZ MRN: [id number] Date of Birth: Sep 29, 2001 Referring Provider (PT): Audelia Hives, DO   Encounter Date: 03/05/2021   PT End of Session - 03/05/21 0930    Visit Number 6    Number of Visits 6    Date for PT Re-Evaluation 03/07/21    PT Start Time 0930   pt 15 min late   PT Stop Time 1000    PT Time Calculation (min) 30 min    Activity Tolerance Patient tolerated treatment well;No increased pain    Behavior During Therapy WFL for tasks assessed/performed           Past Medical History:  Diagnosis Date  . Asthma     Past Surgical History:  Procedure Laterality Date  . HERNIA REPAIR      There were no vitals filed for this visit.   Subjective Assessment - 03/05/21 1002    Subjective Pt late.  No pain .  I understand the exercises.  She is agreeable to discharge    Currently in Pain? No/denies              [facility] PT Assessment - 03/05/21 0001      Strength   Overall Strength Comments Lt shoulder horiz abd 4-/5 and Rt 4/5      Palpation   Palpation comment not painful             [facility] Adult PT Treatment/Exercise - 03/05/21 0001      Self-Care   Self-Care Lifting;Posture    Lifting squat vs hip hinge    Posture form , neck and head positons with lifting      Knee/Hip Exercises: Standing   Functional Squat 10 reps    Functional Squat Limitations 2 sets, 15 then 25 lbs KB    Other Standing Knee Exercises hip hinge 15, 25 lbs KB      Shoulder Exercises: ROM/Strengthening   Other ROM/Strengthening Exercises bird dog x 5 each side      Shoulder Exercises: Stretch   Corner Stretch 3 reps;30 seconds    Other Shoulder Stretches q-ped cat and camel to childs pose added thoracic rotation, prone on elbows brief                   PT Education - 03/05/21 0958    Education Details HEP, lifting, see flowsheet.  Posture, alignment    Person(s) Educated Patient    Methods Explanation    Comprehension Verbalized understanding;Returned demonstration               PT Long Term Goals - 03/05/21 3888      PT LONG TERM GOAL #1   Title Pt will be I with HEP for mid and upper back strength, mobility    Status Achieved      PT LONG TERM GOAL #2   Title Pt will be able to sit with improved awareness of posture and make changes to correct when able.    Status Achieved      PT LONG TERM GOAL #3   Title Pt will demo good squat, lift mechanics for safety at work, lifting 20 lbs from floor to waist height    Status Achieved                 Plan - 03/05/21  1505    Clinical Impression Statement Patient has met her goals and is ready for discharge.  We reviewed lifting today for work and reducing strain on neck and shoulders.  Able to hinge and squat up to 25 lbs .  Offered suggestions for technique.  DC from PT    PT Treatment/Interventions ADLs/Self Care Home Management;Moist Heat;Manual techniques;Patient/family education;Therapeutic exercise;Taping    PT Next Visit Plan continue to progress scapular strengthening and anterior chest wall stretches as able    PT Home Exercise Plan Access Code: [email or web address]  URL: [email or web address]  Date: 02/08/2021  Prepared by: Hessie Diener    Exercises  Cat-Camel to Child's Pose - 1-2 x daily - 7 x weekly - 2 sets - 10 reps - 5 hold  Sidelying Thoracic Rotation with Open Book - 2 x daily - 7 x weekly - 2 sets - 10 reps - 10 hold  Corner Pec Major Stretch - 2 x daily - 7 x weekly - 1 sets - 3-5 reps - 30 hold  Standing Shoulder Horizontal Abduction with Resistance - 2 x daily - 7 x weekly - 2 sets - 10 reps  Shoulder W - External Rotation with Resistance - 2 x daily - 7 x weekly - 2 sets - 10 reps    Consulted and Agree with Plan of Care Patient            Patient will benefit from skilled therapeutic intervention in order to improve the following deficits and impairments:  Decreased mobility,Pain,Postural dysfunction,Impaired UE functional use,Increased fascial restricitons,Decreased strength,Hypomobility  Visit Diagnosis: Abnormal posture  Pain in thoracic spine     Problem List Patient Active Problem List   Diagnosis Date Noted  . 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 Back pain 01/02/2021  . Neck pain 01/02/2021    Alyona Romack 03/05/2021, 10:02 AM  [facility] [address] [place], [place], [address] Phone: [phone]   Fax:  [phone]  Name: SARGUN RUMMELL MRN: [id number] Date of Birth: 11/23/2000   PHYSICAL THERAPY DISCHARGE SUMMARY  Visits from Start of Care:6  Current functional level related to goals / functional outcomes: See above    Remaining deficits: None limiting function   Education / Equipment: HEP, lifting , posture  Plan: Patient agrees to discharge.  Patient goals were met. Patient is being discharged due to meeting the stated rehab goals.  ?????    .Raeford Razor, PT 03/05/21 10:02 AM Phone: [phone] Fax: [phone]

## 2021-03-12 ENCOUNTER — Encounter: Payer: Medicaid Other | Admitting: Physical Therapy

## 2021-03-26 ENCOUNTER — Ambulatory Visit (INDEPENDENT_AMBULATORY_CARE_PROVIDER_SITE_OTHER): Payer: Medicaid Other | Admitting: Surgical

## 2021-03-26 ENCOUNTER — Encounter: Payer: Self-pay | Admitting: Surgical

## 2021-03-26 ENCOUNTER — Other Ambulatory Visit: Payer: Self-pay

## 2021-03-26 VITALS — Wt 151.4 lb

## 2021-03-26 DIAGNOSIS — M542 Cervicalgia: Secondary | ICD-10-CM | POA: Diagnosis not present

## 2021-03-26 DIAGNOSIS — M546 Pain in thoracic spine: Secondary | ICD-10-CM

## 2021-03-26 DIAGNOSIS — N62 Hypertrophy of breast: Secondary | ICD-10-CM | POA: Diagnosis not present

## 2021-03-26 DIAGNOSIS — G8929 Other chronic pain: Secondary | ICD-10-CM

## 2021-03-26 NOTE — Progress Notes (Signed)
Patient is a 20 year old female here for follow-up after completion of physical therapy for symptomatic mammary hypertrophy.  She was initially seen by Dr. Ulice Bold on 01/02/2021 and referred for physical therapy.  She has completed 6 weeks of physical therapy and reports that it overall was helpful, however she is still having daily neck and back pain which is bothersome for her.  She has also lost some weight since her last visit.  She is currently 151 pounds today and was 160 pounds at her first evaluation on 01/02/2021.  On exam patient is in no acute distress, well-developed and well-nourished. Pulmonary effort is normal.  20 year old female here for follow-up after completion of 6 weeks of physical therapy for symptomatic mammary hypertrophy.  She is still interested in pursuing surgical intervention for bilateral breast reduction.  I discussed with her that we would resubmit this to insurance after her completion of physical therapy.  I discussed with her that I would send this to our surgical scheduling team and they would reach out to her in a few weeks once we hear back from insurance.

## 2021-03-28 ENCOUNTER — Telehealth: Payer: Self-pay | Admitting: Plastic Surgery

## 2021-03-28 NOTE — Telephone Encounter (Signed)
Called to schedule mammogram for patient due to breast reduction surgery and maternal family history of breast cancer. S/W Angelique Blonder and per the radiologist protocol, patient is too young to have mammogram and they will only do a breast Ultrasound which is currently scheduled for tomorrow. Patient's provider was notified of this.

## 2021-03-29 ENCOUNTER — Other Ambulatory Visit: Payer: Medicaid Other

## 2021-04-18 ENCOUNTER — Telehealth: Payer: Self-pay | Admitting: Plastic Surgery

## 2021-04-18 NOTE — Telephone Encounter (Signed)
Called patient to advise of Ultrasound appt information. Provided appt date/time and instructions. Patient stated understanding.

## 2021-05-10 ENCOUNTER — Other Ambulatory Visit: Payer: Self-pay

## 2021-05-10 ENCOUNTER — Ambulatory Visit
Admission: RE | Admit: 2021-05-10 | Discharge: 2021-05-10 | Disposition: A | Discharge status: Home / Self Care | Payer: Medicaid Other | Source: Ambulatory Visit | Attending: Plastic Surgery | Admitting: Plastic Surgery

## 2021-05-10 ENCOUNTER — Ambulatory Visit
Admission: RE | Admit: 2021-05-10 | Discharge: 2021-05-10 | Disposition: A | Discharge status: Home / Self Care | Payer: Medicaid Other | Source: Ambulatory Visit | Attending: Surgical | Admitting: Surgical

## 2021-05-10 DIAGNOSIS — N62 Hypertrophy of breast: Secondary | ICD-10-CM

## 2021-05-10 DIAGNOSIS — G8929 Other chronic pain: Secondary | ICD-10-CM

## 2021-05-10 DIAGNOSIS — M542 Cervicalgia: Secondary | ICD-10-CM

## 2021-05-10 DIAGNOSIS — M546 Pain in thoracic spine: Secondary | ICD-10-CM

## 2021-07-17 ENCOUNTER — Encounter: Payer: Medicaid Other | Admitting: Surgical

## 2021-07-17 ENCOUNTER — Encounter: Payer: Self-pay | Admitting: Surgical

## 2021-07-17 NOTE — H&P (View-Only) (Signed)
Patient ID: Wendy Benitez, female    DOB: 2001-03-19, 20 y.o.   MRN: [id number]  Chief Complaint  Patient presents with   Pre-op Exam      ICD-10-CM   1. Chronic bilateral thoracic back pain  M54.6    G89.29     2. 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 Neck pain  M54.2     4. Vapes nicotine containing substance  Z72.0 Nicotine/cotinine metabolites       History of Present Illness: Wendy Benitez is a 20 y.o.  female  with a history of macromastia.  She presents for preoperative evaluation for upcoming procedure, Bilateral Breast Reduction with possible liposuction, scheduled for 08/16/21 with Dr.  Ulice Bold  The patient has not had problems with anesthesia. No history of DVT/PE.  No family history of DVT/PE.  No family or personal history of bleeding or clotting disorders.  Patient is not currently taking any blood thinners.  No history of CVA/MI.   Summary of Previous Visit: stn is 42cm b/l, would like to be C cup or smaller  Estimated excess breast tissue to be removed at time of surgery: 440g on the left and 440g on the right.  PMH Significant for: No significant past medical history.  No recent changes in her health.  She does report that she is on Depo-Provera for birth control.  She does report today that she smokes vape with nicotine.  She does not smoke cigarettes.   Past Medical History: Allergies: No Known Allergies  Current Medications: No current outpatient medications on file.  Past Medical Problems: Past Medical History:  Diagnosis Date   Asthma     Past Surgical History: Past Surgical History:  Procedure Laterality Date   HERNIA REPAIR      Social History: Social History   Socioeconomic History   Marital status: Single    Spouse name: Not on file   Number of children: Not on file   Years of education: Not on file   Highest education level: Not on file  Occupational History   Not on file  Tobacco  Use   Smoking status: Never   Smokeless tobacco: Never  Vaping Use   Vaping Use: Never used  Substance and Sexual Activity   Alcohol use: Yes   Drug use: Never   Sexual activity: Yes  Other Topics Concern   Not on file  Social History Narrative   Not on file   Social Determinants of Health   Financial Resource Strain: Not on file  Food Insecurity: Not on file  Transportation Needs: Not on file  Physical Activity: Not on file  Stress: Not on file  Social Connections: Not on file  Intimate Partner Violence: Not on file    Family History: History reviewed. No pertinent family history.  Review of Systems: Review of Systems  Constitutional: Negative.   Respiratory: Negative.    Cardiovascular: Negative.   Gastrointestinal: Negative.   Neurological: Negative.    Physical Exam: Vital Signs BP 114/79 (BP Location: Left Arm, Patient Position: Sitting, Cuff Size: Normal)   Pulse 83   Ht 5\' 5"  (1.651 m)   Wt 148 lb 12.8 oz (67.5 kg)   SpO2 93%   BMI 24.76 kg/m   Physical Exam Constitutional:      General: Not in acute distress.    Appearance: Normal appearance. Not ill-appearing.  HENT:     Head: Normocephalic and atraumatic.  Eyes:  Pupils: Pupils are equal, round Neck:     Musculoskeletal: Normal range of motion.  Cardiovascular:     Rate and Rhythm: Normal rate    Pulses: Normal pulses.  Pulmonary:     Effort: Pulmonary effort is normal. No respiratory distress.  Musculoskeletal: Normal range of motion.  Skin:    General: Skin is warm and dry.     Findings: No erythema or rash.  Neurological:     General: No focal deficit present.     Mental Status: Alert and oriented to person, place, and time. Mental status is at baseline.     Motor: No weakness.  Psychiatric:        Mood and Affect: Mood normal.        Behavior: Behavior normal.    Assessment/Plan: The patient is scheduled for bilateral breast reduction with Dr. Ulice Bold.  Risks, benefits,  and alternatives of procedure discussed, questions answered and consent obtained.    Smoking Status: Current smokes vape with nicotine ; Counseling Given?  Discussed postop complications, increased risk of complications. Last Mammogram: 05/15/21; Results: negative  Caprini Score: 4, moderate; Risk Factors include: Vape with nicotine, on Depo-Provera shot, and length of planned surgery. Recommendation for mechanical pharmacological prophylaxis. Encourage early ambulation.   Pictures obtained: @consult   Post-op Rx sent to pharmacy:  No prescription sent to pharmacy, will await negative nicotine results prior to sending.  Patient was provided with the breast reduction and General Surgical Risk consent document and Pain Medication Agreement prior to their appointment.  They had adequate time to read through the risk consent documents and Pain Medication Agreement. We also discussed them in person together during this preop appointment. All of their questions were answered to their satisfaction.  Recommended calling if they have any further questions.  Risk consent form and Pain Medication Agreement to be scanned into patient's chart.  The risk that can be encountered with breast reduction were discussed and include the following but not limited to these:  Breast asymmetry, fluid accumulation, firmness of the breast, inability to breast feed, loss of nipple or areola, skin loss, decrease or no nipple sensation, fat necrosis of the breast tissue, bleeding, infection, healing delay.  There are risks of anesthesia, changes to skin sensation and injury to nerves or blood vessels.  The muscle can be temporarily or permanently injured.  You may have an allergic reaction to tape, suture, glue, blood products which can result in skin discoloration, swelling, pain, skin lesions, poor healing.  Any of these can lead to the need for revisonal surgery or stage procedures.  A reduction has potential to interfere with  diagnostic procedures.  Nipple or breast piercing can increase risks of infection.  This procedure is best done when the breast is fully developed.  Changes in the breast will continue to occur over time.  Pregnancy can alter the outcomes of previous breast reduction surgery, weight gain and weigh loss can also effect the long term appearance.   The risks that can be encountered with and after liposuction were discussed and include the following but no limited to these:  Asymmetry, fluid accumulation, firmness of the area, fat necrosis with death of fat tissue, bleeding, infection, delayed healing, anesthesia risks, skin sensation changes, injury to structures including nerves, blood vessels, and muscles which may be temporary or permanent, allergies to tape, suture materials and glues, blood products, topical preparations or injected agents, skin and contour irregularities, skin discoloration and swelling, deep vein thrombosis, cardiac and pulmonary  complications, pain, which may persist, persistent pain, recurrence of the lesion, poor healing of the incision, possible need for revisional surgery or staged procedures. Thiere can also be persistent swelling, poor wound healing, rippling or loose skin, worsening of cellulite, swelling, and thermal burn or heat injury from ultrasound with the ultrasound-assisted lipoplasty technique. Any change in weight fluctuations can alter the outcome.  We discussed the high probability of amputation/free nipple graft technique due to the length of her STN.  She is understanding of the possibility that we would need to transition from a pedicle technique to a free nipple graft technique intraoperatively.  We discussed the risks associated with free nipple graft breast reductions, including but not limited to failure of the graft, partial loss of the graft, loss of sensation of bilateral nipple areola, complete loss of the nipple areola graft, inability to breast-feed,  postoperative wounds, ongoing wound care.  We also discussed the risks associated with the pedicle technique.  We discussed that with the pedicle technique she could develop nipple areolar necrosis which would result in loss of the nipple, this would also result in ongoing wound care and possible changes in the shape of her breast.  Patient will need nicotine test prior to surgery.  I provide the patient with [facility] nicotine test ordered today and recommended completing this 2 weeks prior to surgery.  I discussed with her that if the results come back positive she will need to have surgery postponed for at least 4 to 6 weeks and be retested.  Patient was understanding of the increased risks associated with nicotine use.  Electronically signed by: Kermit Balo Mandy Fitzwater, PA-C 07/18/2021 12:11 PM

## 2021-07-17 NOTE — Progress Notes (Signed)
Patient ID: Wendy Benitez, female    DOB: 2001-03-19, 20 y.o.   MRN: [id number]  Chief Complaint  Patient presents with   Pre-op Exam      ICD-10-CM   1. Chronic bilateral thoracic back pain  M54.6    G89.29     2. 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 Neck pain  M54.2     4. Vapes nicotine containing substance  Z72.0 Nicotine/cotinine metabolites       History of Present Illness: Wendy Benitez is a 20 y.o.  female  with a history of macromastia.  She presents for preoperative evaluation for upcoming procedure, Bilateral Breast Reduction with possible liposuction, scheduled for 08/16/21 with Dr.  Ulice Bold  The patient has not had problems with anesthesia. No history of DVT/PE.  No family history of DVT/PE.  No family or personal history of bleeding or clotting disorders.  Patient is not currently taking any blood thinners.  No history of CVA/MI.   Summary of Previous Visit: stn is 42cm b/l, would like to be C cup or smaller  Estimated excess breast tissue to be removed at time of surgery: 440g on the left and 440g on the right.  PMH Significant for: No significant past medical history.  No recent changes in her health.  She does report that she is on Depo-Provera for birth control.  She does report today that she smokes vape with nicotine.  She does not smoke cigarettes.   Past Medical History: Allergies: No Known Allergies  Current Medications: No current outpatient medications on file.  Past Medical Problems: Past Medical History:  Diagnosis Date   Asthma     Past Surgical History: Past Surgical History:  Procedure Laterality Date   HERNIA REPAIR      Social History: Social History   Socioeconomic History   Marital status: Single    Spouse name: Not on file   Number of children: Not on file   Years of education: Not on file   Highest education level: Not on file  Occupational History   Not on file  Tobacco  Use   Smoking status: Never   Smokeless tobacco: Never  Vaping Use   Vaping Use: Never used  Substance and Sexual Activity   Alcohol use: Yes   Drug use: Never   Sexual activity: Yes  Other Topics Concern   Not on file  Social History Narrative   Not on file   Social Determinants of Health   Financial Resource Strain: Not on file  Food Insecurity: Not on file  Transportation Needs: Not on file  Physical Activity: Not on file  Stress: Not on file  Social Connections: Not on file  Intimate Partner Violence: Not on file    Family History: History reviewed. No pertinent family history.  Review of Systems: Review of Systems  Constitutional: Negative.   Respiratory: Negative.    Cardiovascular: Negative.   Gastrointestinal: Negative.   Neurological: Negative.    Physical Exam: Vital Signs BP 114/79 (BP Location: Left Arm, Patient Position: Sitting, Cuff Size: Normal)   Pulse 83   Ht 5\' 5"  (1.651 m)   Wt 148 lb 12.8 oz (67.5 kg)   SpO2 93%   BMI 24.76 kg/m   Physical Exam Constitutional:      General: Not in acute distress.    Appearance: Normal appearance. Not ill-appearing.  HENT:     Head: Normocephalic and atraumatic.  Eyes:  Pupils: Pupils are equal, round Neck:     Musculoskeletal: Normal range of motion.  Cardiovascular:     Rate and Rhythm: Normal rate    Pulses: Normal pulses.  Pulmonary:     Effort: Pulmonary effort is normal. No respiratory distress.  Musculoskeletal: Normal range of motion.  Skin:    General: Skin is warm and dry.     Findings: No erythema or rash.  Neurological:     General: No focal deficit present.     Mental Status: Alert and oriented to person, place, and time. Mental status is at baseline.     Motor: No weakness.  Psychiatric:        Mood and Affect: Mood normal.        Behavior: Behavior normal.    Assessment/Plan: The patient is scheduled for bilateral breast reduction with Dr. Ulice Bold.  Risks, benefits,  and alternatives of procedure discussed, questions answered and consent obtained.    Smoking Status: Current smokes vape with nicotine ; Counseling Given?  Discussed postop complications, increased risk of complications. Last Mammogram: 05/15/21; Results: negative  Caprini Score: 4, moderate; Risk Factors include: Vape with nicotine, on Depo-Provera shot, and length of planned surgery. Recommendation for mechanical pharmacological prophylaxis. Encourage early ambulation.   Pictures obtained: @consult   Post-op Rx sent to pharmacy:  No prescription sent to pharmacy, will await negative nicotine results prior to sending.  Patient was provided with the breast reduction and General Surgical Risk consent document and Pain Medication Agreement prior to their appointment.  They had adequate time to read through the risk consent documents and Pain Medication Agreement. We also discussed them in person together during this preop appointment. All of their questions were answered to their satisfaction.  Recommended calling if they have any further questions.  Risk consent form and Pain Medication Agreement to be scanned into patient's chart.  The risk that can be encountered with breast reduction were discussed and include the following but not limited to these:  Breast asymmetry, fluid accumulation, firmness of the breast, inability to breast feed, loss of nipple or areola, skin loss, decrease or no nipple sensation, fat necrosis of the breast tissue, bleeding, infection, healing delay.  There are risks of anesthesia, changes to skin sensation and injury to nerves or blood vessels.  The muscle can be temporarily or permanently injured.  You may have an allergic reaction to tape, suture, glue, blood products which can result in skin discoloration, swelling, pain, skin lesions, poor healing.  Any of these can lead to the need for revisonal surgery or stage procedures.  A reduction has potential to interfere with  diagnostic procedures.  Nipple or breast piercing can increase risks of infection.  This procedure is best done when the breast is fully developed.  Changes in the breast will continue to occur over time.  Pregnancy can alter the outcomes of previous breast reduction surgery, weight gain and weigh loss can also effect the long term appearance.   The risks that can be encountered with and after liposuction were discussed and include the following but no limited to these:  Asymmetry, fluid accumulation, firmness of the area, fat necrosis with death of fat tissue, bleeding, infection, delayed healing, anesthesia risks, skin sensation changes, injury to structures including nerves, blood vessels, and muscles which may be temporary or permanent, allergies to tape, suture materials and glues, blood products, topical preparations or injected agents, skin and contour irregularities, skin discoloration and swelling, deep vein thrombosis, cardiac and pulmonary  complications, pain, which may persist, persistent pain, recurrence of the lesion, poor healing of the incision, possible need for revisional surgery or staged procedures. Thiere can also be persistent swelling, poor wound healing, rippling or loose skin, worsening of cellulite, swelling, and thermal burn or heat injury from ultrasound with the ultrasound-assisted lipoplasty technique. Any change in weight fluctuations can alter the outcome.  We discussed the high probability of amputation/free nipple graft technique due to the length of her STN.  She is understanding of the possibility that we would need to transition from a pedicle technique to a free nipple graft technique intraoperatively.  We discussed the risks associated with free nipple graft breast reductions, including but not limited to failure of the graft, partial loss of the graft, loss of sensation of bilateral nipple areola, complete loss of the nipple areola graft, inability to breast-feed,  postoperative wounds, ongoing wound care.  We also discussed the risks associated with the pedicle technique.  We discussed that with the pedicle technique she could develop nipple areolar necrosis which would result in loss of the nipple, this would also result in ongoing wound care and possible changes in the shape of her breast.  Patient will need nicotine test prior to surgery.  I provide the patient with [facility] nicotine test ordered today and recommended completing this 2 weeks prior to surgery.  I discussed with her that if the results come back positive she will need to have surgery postponed for at least 4 to 6 weeks and be retested.  Patient was understanding of the increased risks associated with nicotine use.  Electronically signed by: Kermit Balo Shakyla Nolley, PA-C 07/18/2021 12:11 PM

## 2021-07-18 ENCOUNTER — Other Ambulatory Visit: Payer: Self-pay

## 2021-07-18 ENCOUNTER — Ambulatory Visit (INDEPENDENT_AMBULATORY_CARE_PROVIDER_SITE_OTHER): Payer: Medicaid Other | Admitting: Surgical

## 2021-07-18 VITALS — BP 114/79 | HR 83 | Ht 65.0 in | Wt 148.8 lb

## 2021-07-18 DIAGNOSIS — Z720 Tobacco use: Secondary | ICD-10-CM

## 2021-07-18 DIAGNOSIS — M542 Cervicalgia: Secondary | ICD-10-CM

## 2021-07-18 DIAGNOSIS — G8929 Other chronic pain: Secondary | ICD-10-CM

## 2021-07-18 DIAGNOSIS — M546 Pain in thoracic spine: Secondary | ICD-10-CM

## 2021-07-18 DIAGNOSIS — N62 Hypertrophy of breast: Secondary | ICD-10-CM

## 2021-08-09 ENCOUNTER — Other Ambulatory Visit: Payer: Self-pay

## 2021-08-09 ENCOUNTER — Encounter (HOSPITAL_BASED_OUTPATIENT_CLINIC_OR_DEPARTMENT_OTHER): Payer: Self-pay | Admitting: Plastic Surgery

## 2021-08-16 ENCOUNTER — Encounter (HOSPITAL_BASED_OUTPATIENT_CLINIC_OR_DEPARTMENT_OTHER)
Admission: RE | Disposition: A | Discharge status: Home / Self Care | Payer: Self-pay | Source: Home / Self Care | Attending: Plastic Surgery

## 2021-08-16 ENCOUNTER — Encounter (HOSPITAL_BASED_OUTPATIENT_CLINIC_OR_DEPARTMENT_OTHER): Payer: Self-pay | Admitting: Plastic Surgery

## 2021-08-16 ENCOUNTER — Ambulatory Visit (HOSPITAL_BASED_OUTPATIENT_CLINIC_OR_DEPARTMENT_OTHER)
Admission: RE | Admit: 2021-08-16 | Discharge: 2021-08-16 | Disposition: A | Discharge status: Home / Self Care | Payer: Medicaid Other | Attending: Plastic Surgery | Admitting: Plastic Surgery

## 2021-08-16 ENCOUNTER — Ambulatory Visit (HOSPITAL_BASED_OUTPATIENT_CLINIC_OR_DEPARTMENT_OTHER): Payer: Medicaid Other | Admitting: Anesthesiology

## 2021-08-16 ENCOUNTER — Other Ambulatory Visit: Payer: Self-pay

## 2021-08-16 ENCOUNTER — Other Ambulatory Visit: Payer: Self-pay | Admitting: Surgical

## 2021-08-16 DIAGNOSIS — J45909 Unspecified asthma, uncomplicated: Secondary | ICD-10-CM | POA: Diagnosis not present

## 2021-08-16 DIAGNOSIS — N62 Hypertrophy of breast: Secondary | ICD-10-CM

## 2021-08-16 DIAGNOSIS — G8929 Other chronic pain: Secondary | ICD-10-CM

## 2021-08-16 DIAGNOSIS — M546 Pain in thoracic spine: Secondary | ICD-10-CM

## 2021-08-16 DIAGNOSIS — M542 Cervicalgia: Secondary | ICD-10-CM | POA: Insufficient documentation

## 2021-08-16 DIAGNOSIS — F17290 Nicotine dependence, other tobacco product, uncomplicated: Secondary | ICD-10-CM | POA: Insufficient documentation

## 2021-08-16 HISTORY — PX: BREAST REDUCTION SURGERY: SHX8

## 2021-08-16 LAB — POCT PREGNANCY, URINE: Preg Test, Ur: NEGATIVE

## 2021-08-16 SURGERY — BREAST REDUCTION WITH LIPOSUCTION
Anesthesia: General | Site: Breast | Laterality: Bilateral

## 2021-08-16 MED ORDER — SODIUM CHLORIDE 0.9% FLUSH: 3.0000 mL | Freq: Two times a day (BID) | INTRAVENOUS | Status: DC

## 2021-08-16 MED ORDER — CEPHALEXIN 500 MG PO CAPS: 500.0000 mg | ORAL_CAPSULE | Freq: Four times a day (QID) | ORAL | 0 refills | Status: AC

## 2021-08-16 MED ORDER — SCOPOLAMINE 1 MG/3DAYS TD PT72
MEDICATED_PATCH | TRANSDERMAL | Status: AC
  Filled 2021-08-16: qty 1

## 2021-08-16 MED ORDER — MIDAZOLAM HCL 5 MG/5ML IJ SOLN
INTRAMUSCULAR | Status: DC | PRN
  Administered 2021-08-16: 2 mg via INTRAVENOUS

## 2021-08-16 MED ORDER — HYDROMORPHONE HCL 1 MG/ML IJ SOLN
INTRAMUSCULAR | Status: AC
  Filled 2021-08-16: qty 0.5

## 2021-08-16 MED ORDER — LIDOCAINE HCL (CARDIAC) PF 100 MG/5ML IV SOSY
PREFILLED_SYRINGE | INTRAVENOUS | Status: DC | PRN
  Administered 2021-08-16: 60 mg via INTRAVENOUS

## 2021-08-16 MED ORDER — ROCURONIUM BROMIDE 100 MG/10ML IV SOLN
INTRAVENOUS | Status: DC | PRN
  Administered 2021-08-16: 70 mg via INTRAVENOUS

## 2021-08-16 MED ORDER — ONDANSETRON HCL 4 MG/2ML IJ SOLN
INTRAMUSCULAR | Status: DC | PRN
  Administered 2021-08-16: 4 mg via INTRAVENOUS

## 2021-08-16 MED ORDER — OXYCODONE HCL 5 MG PO TABS: 5.0000 mg | ORAL_TABLET | ORAL | Status: DC | PRN

## 2021-08-16 MED ORDER — ONDANSETRON HCL 4 MG PO TABS: 4.0000 mg | ORAL_TABLET | Freq: Three times a day (TID) | ORAL | 0 refills | Status: AC | PRN

## 2021-08-16 MED ORDER — SODIUM CHLORIDE 0.9% FLUSH: 3.0000 mL | INTRAVENOUS | Status: DC | PRN

## 2021-08-16 MED ORDER — ACETAMINOPHEN 325 MG RE SUPP: 650.0000 mg | RECTAL | Status: DC | PRN

## 2021-08-16 MED ORDER — OXYCODONE HCL 5 MG PO TABS
5.0000 mg | ORAL_TABLET | Freq: Once | ORAL | Status: AC | PRN
  Administered 2021-08-16: 5 mg via ORAL

## 2021-08-16 MED ORDER — ACETAMINOPHEN 500 MG PO TABS
1000.0000 mg | ORAL_TABLET | Freq: Once | ORAL | Status: AC
  Administered 2021-08-16: 1000 mg via ORAL

## 2021-08-16 MED ORDER — OXYCODONE HCL 5 MG PO TABS
ORAL_TABLET | ORAL | Status: AC
  Filled 2021-08-16: qty 1

## 2021-08-16 MED ORDER — HYDROMORPHONE HCL 1 MG/ML IJ SOLN
INTRAMUSCULAR | Status: DC | PRN
  Administered 2021-08-16 (×2): .5 mg via INTRAVENOUS

## 2021-08-16 MED ORDER — PROPOFOL 10 MG/ML IV BOLUS
INTRAVENOUS | Status: DC | PRN
  Administered 2021-08-16: 200 mg via INTRAVENOUS

## 2021-08-16 MED ORDER — SODIUM CHLORIDE 0.9 % IV SOLN: 250.0000 mL | INTRAVENOUS | Status: DC | PRN

## 2021-08-16 MED ORDER — MORPHINE SULFATE (PF) 4 MG/ML IV SOLN: 2.0000 mg | INTRAVENOUS | Status: DC | PRN

## 2021-08-16 MED ORDER — MIDAZOLAM HCL 2 MG/2ML IJ SOLN
INTRAMUSCULAR | Status: AC
  Filled 2021-08-16: qty 2

## 2021-08-16 MED ORDER — OXYCODONE HCL 5 MG/5ML PO SOLN
5.0000 mg | Freq: Once | ORAL | Status: AC | PRN
Start: 2021-08-16 — End: 2021-08-16

## 2021-08-16 MED ORDER — ACETAMINOPHEN 500 MG PO TABS
ORAL_TABLET | ORAL | Status: AC
  Filled 2021-08-16: qty 2

## 2021-08-16 MED ORDER — SCOPOLAMINE 1 MG/3DAYS TD PT72
1.0000 | MEDICATED_PATCH | TRANSDERMAL | Status: DC
  Administered 2021-08-16: 1.5 mg via TRANSDERMAL

## 2021-08-16 MED ORDER — CHLORHEXIDINE GLUCONATE CLOTH 2 % EX PADS: 6.0000 | MEDICATED_PAD | Freq: Once | CUTANEOUS | Status: DC

## 2021-08-16 MED ORDER — DROPERIDOL 2.5 MG/ML IJ SOLN: 0.6250 mg | Freq: Once | INTRAMUSCULAR | Status: DC | PRN

## 2021-08-16 MED ORDER — DEXMEDETOMIDINE HCL IN NACL 200 MCG/50ML IV SOLN
INTRAVENOUS | Status: DC | PRN
  Administered 2021-08-16: 8 ug via INTRAVENOUS

## 2021-08-16 MED ORDER — HYDROCODONE-ACETAMINOPHEN 5-325 MG PO TABS: 1.0000 | ORAL_TABLET | Freq: Four times a day (QID) | ORAL | 0 refills | Status: AC | PRN

## 2021-08-16 MED ORDER — CEFAZOLIN SODIUM-DEXTROSE 2-4 GM/100ML-% IV SOLN
INTRAVENOUS | Status: AC
  Filled 2021-08-16: qty 100

## 2021-08-16 MED ORDER — SUFENTANIL CITRATE 50 MCG/ML IV SOLN
INTRAVENOUS | Status: DC | PRN
  Administered 2021-08-16 (×2): 10 ug via INTRAVENOUS

## 2021-08-16 MED ORDER — NITROGLYCERIN 2 % TD OINT
TOPICAL_OINTMENT | TRANSDERMAL | Status: AC
  Filled 2021-08-16: qty 30

## 2021-08-16 MED ORDER — CEFAZOLIN SODIUM-DEXTROSE 2-4 GM/100ML-% IV SOLN
2.0000 g | INTRAVENOUS | Status: AC
  Administered 2021-08-16: 2 g via INTRAVENOUS

## 2021-08-16 MED ORDER — ACETAMINOPHEN 325 MG PO TABS: 650.0000 mg | ORAL_TABLET | ORAL | Status: DC | PRN

## 2021-08-16 MED ORDER — LIDOCAINE HCL 1 % IJ SOLN
INTRAVENOUS | Status: DC | PRN
  Administered 2021-08-16: 200 mL

## 2021-08-16 MED ORDER — DEXAMETHASONE SODIUM PHOSPHATE 4 MG/ML IJ SOLN
INTRAMUSCULAR | Status: DC | PRN
  Administered 2021-08-16: 10 mg via INTRAVENOUS

## 2021-08-16 MED ORDER — EPHEDRINE SULFATE 50 MG/ML IJ SOLN
INTRAMUSCULAR | Status: DC | PRN
  Administered 2021-08-16: 10 mg via INTRAVENOUS

## 2021-08-16 MED ORDER — SUGAMMADEX SODIUM 200 MG/2ML IV SOLN
INTRAVENOUS | Status: DC | PRN
  Administered 2021-08-16: 200 mg via INTRAVENOUS

## 2021-08-16 MED ORDER — FENTANYL CITRATE (PF) 100 MCG/2ML IJ SOLN: 25.0000 ug | INTRAMUSCULAR | Status: DC | PRN

## 2021-08-16 MED ORDER — DIPHENHYDRAMINE HCL 50 MG/ML IJ SOLN
INTRAMUSCULAR | Status: DC | PRN
  Administered 2021-08-16: 6.25 mg via INTRAVENOUS

## 2021-08-16 MED ORDER — PROMETHAZINE HCL 25 MG/ML IJ SOLN: 6.2500 mg | INTRAMUSCULAR | Status: DC | PRN

## 2021-08-16 MED ORDER — LIDOCAINE-EPINEPHRINE 1 %-1:100000 IJ SOLN
INTRAMUSCULAR | Status: DC | PRN
  Administered 2021-08-16: 50 mL via INTRAMUSCULAR

## 2021-08-16 MED ORDER — SUFENTANIL CITRATE 50 MCG/ML IV SOLN
INTRAVENOUS | Status: AC
  Filled 2021-08-16: qty 1

## 2021-08-16 MED ORDER — LACTATED RINGERS IV SOLN: INTRAVENOUS | Status: DC

## 2021-08-16 SURGICAL SUPPLY — 64 items
ADH SKN CLS APL DERMABOND .7 (GAUZE/BANDAGES/DRESSINGS) ×2
BAG DECANTER FOR FLEXI CONT (MISCELLANEOUS) ×2 IMPLANT
BINDER BREAST XLRG (GAUZE/BANDAGES/DRESSINGS) ×2 IMPLANT
BIOPATCH RED 1 DISK 7.0 (GAUZE/BANDAGES/DRESSINGS) IMPLANT
BLADE KNIFE PERSONA 10 (BLADE) ×4 IMPLANT
BLADE SURG 15 STRL LF DISP TIS (BLADE) ×1 IMPLANT
BLADE SURG 15 STRL SS (BLADE) ×2
CANISTER SUCT 1200ML W/VALVE (MISCELLANEOUS) ×2 IMPLANT
COVER BACK TABLE 60X90IN (DRAPES) ×2 IMPLANT
COVER MAYO STAND STRL (DRAPES) ×2 IMPLANT
DECANTER SPIKE VIAL GLASS SM (MISCELLANEOUS) IMPLANT
DERMABOND ADVANCED (GAUZE/BANDAGES/DRESSINGS) ×2
DERMABOND ADVANCED .7 DNX12 (GAUZE/BANDAGES/DRESSINGS) ×2 IMPLANT
DRAIN CHANNEL 19F RND (DRAIN) IMPLANT
DRAPE LAPAROSCOPIC ABDOMINAL (DRAPES) ×2 IMPLANT
DRSG OPSITE 6X11 MED (GAUZE/BANDAGES/DRESSINGS) ×4 IMPLANT
DRSG OPSITE POSTOP 4X6 (GAUZE/BANDAGES/DRESSINGS) ×4 IMPLANT
DRSG PAD ABDOMINAL 8X10 ST (GAUZE/BANDAGES/DRESSINGS) ×4 IMPLANT
ELECT BLADE 4.0 EZ CLEAN MEGAD (MISCELLANEOUS)
ELECT REM PT RETURN 9FT ADLT (ELECTROSURGICAL) ×2
ELECTRODE BLDE 4.0 EZ CLN MEGD (MISCELLANEOUS) IMPLANT
ELECTRODE REM PT RTRN 9FT ADLT (ELECTROSURGICAL) ×1 IMPLANT
EVACUATOR SILICONE 100CC (DRAIN) IMPLANT
GLOVE SURG ENC MOIS LTX SZ6.5 (GLOVE) ×6 IMPLANT
GLOVE SURG ENC MOIS LTX SZ7.5 (GLOVE) ×2 IMPLANT
GLOVE SURG ENC MOIS LTX SZ8 (GLOVE) ×4 IMPLANT
GLOVE SURG POLYISO LF SZ6.5 (GLOVE) ×6 IMPLANT
GLOVE SURG UNDER POLY LF SZ6.5 (GLOVE) ×6 IMPLANT
GLOVE SURG UNDER POLY LF SZ8.5 (GLOVE) ×4 IMPLANT
GOWN STRL REUS W/ TWL LRG LVL3 (GOWN DISPOSABLE) ×2 IMPLANT
GOWN STRL REUS W/ TWL XL LVL3 (GOWN DISPOSABLE) ×3 IMPLANT
GOWN STRL REUS W/TWL LRG LVL3 (GOWN DISPOSABLE) ×4
GOWN STRL REUS W/TWL XL LVL3 (GOWN DISPOSABLE) ×6
MARKER SKIN DUAL TIP RULER LAB (MISCELLANEOUS) ×2 IMPLANT
NEEDLE FILTER BLUNT 18X 1/2SAF (NEEDLE) ×1
NEEDLE FILTER BLUNT 18X1 1/2 (NEEDLE) ×1 IMPLANT
NEEDLE HYPO 25X1 1.5 SAFETY (NEEDLE) ×2 IMPLANT
NS IRRIG 1000ML POUR BTL (IV SOLUTION) ×2 IMPLANT
PACK BASIN DAY SURGERY FS (CUSTOM PROCEDURE TRAY) ×2 IMPLANT
PAD ALCOHOL SWAB (MISCELLANEOUS) ×2 IMPLANT
PAD FOAM SILICONE BACKED (GAUZE/BANDAGES/DRESSINGS) IMPLANT
PENCIL SMOKE EVACUATOR (MISCELLANEOUS) ×2 IMPLANT
PIN SAFETY STERILE (MISCELLANEOUS) IMPLANT
SLEEVE SCD COMPRESS KNEE MED (STOCKING) ×2 IMPLANT
SPONGE T-LAP 18X18 ~~LOC~~+RFID (SPONGE) ×6 IMPLANT
STRIP SUTURE WOUND CLOSURE 1/2 (MISCELLANEOUS) ×4 IMPLANT
SUT MNCRL AB 4-0 PS2 18 (SUTURE) ×20 IMPLANT
SUT MON AB 3-0 SH 27 (SUTURE) ×14
SUT MON AB 3-0 SH27 (SUTURE) ×7 IMPLANT
SUT MON AB 5-0 PS2 18 (SUTURE) IMPLANT
SUT PDS 3-0 CT2 (SUTURE) ×14
SUT PDS II 3-0 CT2 27 ABS (SUTURE) ×7 IMPLANT
SUT PLAIN 5 0 P 3 18 (SUTURE) ×2 IMPLANT
SUT SILK 3 0 PS 1 (SUTURE) IMPLANT
SYR 20ML LL LF (SYRINGE) ×2 IMPLANT
SYR BULB IRRIG 60ML STRL (SYRINGE) ×2 IMPLANT
SYR CONTROL 10ML LL (SYRINGE) ×2 IMPLANT
TOWEL GREEN STERILE FF (TOWEL DISPOSABLE) ×4 IMPLANT
TRAY DSU PREP LF (CUSTOM PROCEDURE TRAY) ×2 IMPLANT
TUBE CONNECTING 20X1/4 (TUBING) ×2 IMPLANT
TUBING INFILTRATION IT-10001 (TUBING) ×2 IMPLANT
TUBING SET GRADUATE ASPIR 12FT (MISCELLANEOUS) ×2 IMPLANT
UNDERPAD 30X36 HEAVY ABSORB (UNDERPADS AND DIAPERS) ×4 IMPLANT
YANKAUER SUCT BULB TIP NO VENT (SUCTIONS) ×2 IMPLANT

## 2021-08-16 NOTE — Anesthesia Procedure Notes (Signed)
Procedure Name: Intubation Date/Time: 08/16/2021 10:37 AM Performed by: Willa Frater, CRNA Pre-anesthesia Checklist: Patient identified, Emergency Drugs available, Suction available and Patient being monitored Patient Re-evaluated:Patient Re-evaluated prior to induction Oxygen Delivery Method: Circle system utilized Preoxygenation: Pre-oxygenation with 100% oxygen Induction Type: IV induction Ventilation: Mask ventilation without difficulty Laryngoscope Size: Mac and 3 Grade View: Grade I Tube type: Oral Tube size: 7.0 mm Number of attempts: 1 Airway Equipment and Method: Stylet and Oral airway Placement Confirmation: ETT inserted through vocal cords under direct vision, positive ETCO2 and breath sounds checked- equal and bilateral Tube secured with: Tape Dental Injury: Teeth and Oropharynx as per pre-operative assessment

## 2021-08-16 NOTE — Interval H&P Note (Signed)
History and Physical Interval Note:  08/16/2021 10:06 AM  [name]  has presented today for surgery, with the diagnosis of mammary hypertrophy.  The various methods of treatment have been discussed with the patient and family. After consideration of risks, benefits and other options for treatment, the patient has consented to  Procedure(s) with comments: BREAST REDUCTION WITH LIPOSUCTION (Bilateral) - 3.5 hours as a surgical intervention.  The patient's history has been reviewed, patient examined, no change in status, stable for surgery.  I have reviewed the patient's chart and labs.  Questions were answered to the patient's satisfaction.     [name] [name]

## 2021-08-16 NOTE — Anesthesia Preprocedure Evaluation (Addendum)
Anesthesia Evaluation  Patient identified by MRN, date of birth, ID band Patient awake    Reviewed: Allergy & Precautions, NPO status , Patient's Chart, lab work & pertinent test results  Airway Mallampati: II  TM Distance: >3 FB Neck ROM: Full    Dental no notable dental hx.    Pulmonary asthma ,    Pulmonary exam normal breath sounds clear to auscultation       Cardiovascular negative cardio ROS Normal cardiovascular exam Rhythm:Regular Rate:Normal     Neuro/Psych negative neurological ROS  negative psych ROS   GI/Hepatic negative GI ROS, Neg liver ROS,   Endo/Other  negative endocrine ROS  Renal/GU negative Renal ROS  negative genitourinary   Musculoskeletal negative musculoskeletal ROS (+)   Abdominal Normal abdominal exam  (+)   Peds negative pediatric ROS (+)  Hematology negative hematology ROS (+)   Anesthesia Other Findings Very large fake eyelashes  Reproductive/Obstetrics negative OB ROS                            Anesthesia Physical Anesthesia Plan  ASA: 2  Anesthesia Plan: General   Post-op Pain Management:    Induction: Intravenous  PONV Risk Score and Plan: 3 and Treatment may vary due to age or medical condition, Scopolamine patch - Pre-op, Midazolam, Ondansetron and Dexamethasone  Airway Management Planned: Oral ETT  Additional Equipment: None  Intra-op Plan:   Post-operative Plan: Extubation in OR  Informed Consent: I have reviewed the patients History and Physical, chart, labs and discussed the procedure including the risks, benefits and alternatives for the proposed anesthesia with the patient or authorized representative who has indicated his/her understanding and acceptance.     Dental advisory given  Plan Discussed with: CRNA, Anesthesiologist and Surgeon  Anesthesia Plan Comments:        Anesthesia Quick Evaluation

## 2021-08-16 NOTE — Transfer of Care (Signed)
Immediate Anesthesia Transfer of Care Note  Patient: Wendy Benitez  Procedure(s) Performed: BILATERAL BREAST REDUCTION WITH LIPOSUCTION (Bilateral: Breast)  Patient Location: PACU  Anesthesia Type:General  Level of Consciousness: awake, alert  and oriented  Airway & Oxygen Therapy: Patient Spontanous Breathing and Patient connected to face mask oxygen  Post-op Assessment: Report given to RN and Post -op Vital signs reviewed and stable  Post vital signs: Reviewed and stable  Last Vitals:  Vitals Value Taken Time  BP    Temp    Pulse    Resp    SpO2      Last Pain:  Vitals:   08/16/21 0946  TempSrc: Oral  PainSc: 0-No pain      Patients Stated Pain Goal: 3 (08/16/21 0946)  Complications: No notable events documented.

## 2021-08-16 NOTE — Op Note (Signed)
Breast Reduction Op note:    DATE OF PROCEDURE: 08/16/2021  LOCATION: [facility]  SURGEON: Alan Ripper Sanger Antia Rahal, DO  ASSISTANT:  Dr. Janne Napoleon, MD 2.   Evelena Leyden, PA  PREOPERATIVE DIAGNOSIS 1. Macromastia 2. Neck Pain 3. Back Pain  POSTOPERATIVE DIAGNOSIS 1. Macromastia 2. Neck Pain 3. Back Pain  PROCEDURES 1. Bilateral breast reduction.  Right reduction 916 g, Left reduction 916 g  COMPLICATIONS: None.  DRAINS: none  INDICATIONS FOR PROCEDURE Wendy Benitez is a 20 y.o. year-old female born on [birth year],with a history of symptomatic macromastia with concominant back pain, neck pain, shoulder grooving from her bra.   MRN: [id number]  CONSENT Informed consent was obtained directly from the patient. The risks, benefits and alternatives were fully discussed. Specific risks including but not limited to bleeding, infection, hematoma, seroma, scarring, pain, nipple necrosis, asymmetry, poor cosmetic results, and need for further surgery were discussed. The patient had ample opportunity to have her questions answered to her satisfaction.  DESCRIPTION OF PROCEDURE  Patient was brought into the operating room and placed in a supine position.  SCDs were placed and appropriate padding was performed.  Antibiotics were given. The patient underwent general anesthesia and the chest was prepped and draped in a sterile fashion.  A timeout was performed and all information was confirmed to be correct. Tumescent was placed laterally in each breast.  Right side: Preoperative markings were confirmed.  Incision lines were injected with local with epinephrine.  Liposuction was done laterally.  After waiting for vasoconstriction, the marked lines were incised.  A Wise-pattern superomedial breast reduction was performed by de-epithelializing the pedicle, using bovie to create the superomedial pedicle, and removing breast tissue from the lateral and inferior  portions of the breast.  Care was taken to not undermine the breast pedicle. Hemostasis was achieved.  The nipple was gently rotated into position and the soft tissue closed with 4-0 Monocryl.   The pocket was irrigated and hemostasis confirmed.  The deep tissues were approximated with 3-0 PDS and Monocryl sutures and the skin was closed with deep dermal and subcuticular 4-0 Monocryl sutures.  The nipple and skin flaps had good capillary refill at the end of the procedure.    Left side: Preoperative markings were confirmed.  Incision lines were injected with local with epinephrine.  Liposuction was done laterally. After waiting for vasoconstriction, the marked lines were incised.  A Wise-pattern superomedial breast reduction was performed by de-epithelializing the pedicle, using bovie to create the superomedial pedicle, and removing breast tissue from the lateral and inferior portions of the breast.  Care was taken to not undermine the breast pedicle. Hemostasis was achieved.  The nipple was gently rotated into position and the soft tissue was closed with 4-0 Monocryl.  The patient was sat upright and size and shape symmetry was confirmed.  The pocket was irrigated and hemostasis confirmed.  The deep tissues were approximated with 3-0 PDS and  Monocryl sutures and the skin was closed with deep dermal and subcuticular 4-0 Monocryl sutures.  Dermabond was applied.  A breast binder and ABDs were placed.  The nipple and skin flaps had good capillary refill at the end of the procedure.  The patient tolerated the procedure well. The patient was allowed to wake from anesthesia and taken to the recovery room in satisfactory condition.  The surgeon assisted throughout the case.  The surgeon was essential in retraction and counter traction when needed to make the case progress smoothly.  This retraction and assistance made it possible to see the tissue plans for the procedure.  The assistance was needed for blood control,  tissue re-approximation and assisted with closure of the incision site.

## 2021-08-16 NOTE — Discharge Instructions (Addendum)
Post Anesthesia Home Care Instructions  Activity: Get plenty of rest for the remainder of the day. A responsible individual must stay with you for 24 hours following the procedure.  For the next 24 hours, DO NOT: -Drive a car -Advertising copywriter -Drink alcoholic beverages -Take any medication unless instructed by your physician -Make any legal decisions or sign important papers.  Meals: Start with liquid foods such as gelatin or soup. Progress to regular foods as tolerated. Avoid greasy, spicy, heavy foods. If nausea and/or vomiting occur, drink only clear liquids until the nausea and/or vomiting subsides. Call your physician if vomiting continues.  Special Instructions/Symptoms: Your throat may feel dry or sore from the anesthesia or the breathing tube placed in your throat during surgery. If this causes discomfort, gargle with warm salt water. The discomfort should disappear within 24 hours.  If you had a scopolamine patch placed behind your ear for the management of post- operative nausea and/or vomiting:  1. The medication in the patch is effective for 72 hours, after which it should be removed.  Wrap patch in a tissue and discard in the trash. Wash hands thoroughly with soap and water. 2. You may remove the patch earlier than 72 hours if you experience unpleasant side effects which may include dry mouth, dizziness or visual disturbances. 3. Avoid touching the patch. Wash your hands with soap and water after contact with the patch.     No tylenol until after 4pm today.   INSTRUCTIONS FOR AFTER SURGERY   You will likely have some questions about what to expect following your operation.  The following information will help you and your family understand what to expect when you are discharged from the hospital.  Following these guidelines will help ensure a smooth recovery and reduce risks of complications.  Postoperative instructions include information on: diet, wound care,  medications and physical activity.  AFTER SURGERY Expect to go home after the procedure.  In some cases, you may need to spend one night in the hospital for observation.  DIET This surgery does not require a specific diet.  However, I have to mention that the healthier you eat the better your body can start healing. It is important to increasing your protein intake.  This means limiting the foods with added sugar.  Focus on fruits and vegetables and some meat. It is very important to drink water after your surgery.  If your urine is bright yellow, then it is concentrated, and you need to drink more water.  As a general rule after surgery, you should have 8 ounces of water every hour while awake.  If you find you are persistently nauseated or unable to take in liquids let us know.  NO TOBACCO USE or EXPOSURE.  This will slow your healing process and increase the risk of a wound.  WOUND CARE You can shower the day after surgery.  Use fragrance free soap.  Dial, Dove, [place] and Cetaphil are usually mild on the skin.  If you have steri-strips / tape directly attached to your skin leave them in place. It is OK to get these wet.  No baths, pools or hot tubs for two weeks. We close your incision to leave the smallest and best-looking scar. No ointment or creams on your incisions until given the go ahead.  Especially not Neosporin (Too many skin reactions with this one).  A few weeks after surgery you can use Mederma and start massaging the scar. We ask you to wear  your binder or sports bra for the first 6 weeks around the clock, including while sleeping. This provides added comfort and helps reduce the fluid accumulation at the surgery site.  ACTIVITY No heavy lifting until cleared by the doctor.  It is OK to walk and climb stairs. In fact, moving your legs is very important to decrease your risk of a blood clot.  It will also help keep you from getting deconditioned.  Every 1 to 2 hours get up and walk for  5 minutes. This will help with a quicker recovery back to normal.  Let pain be your guide so you don't do too much.  NO, you cannot do the spring cleaning and don't plan on taking care of anyone else.  This is your time for TLC.   WORK Everyone returns to work at different times. As a rough guide, most people take at least 1 - 2 weeks off prior to returning to work. If you need documentation for your job, bring the forms to your postoperative follow up visit.  DRIVING Arrange for someone to bring you home from the hospital.  You may be able to drive a few days after surgery but not while taking any narcotics or valium.  BOWEL MOVEMENTS Constipation can occur after anesthesia and while taking pain medication.  It is important to stay ahead for your comfort.  We recommend taking Milk of Magnesia (2 tablespoons; twice a day) while taking the pain pills.  SEROMA This is fluid your body tried to put in the surgical site.  This is normal but if it creates excessive pain and swelling let us know.  It usually decreases in a few weeks.  MEDICATIONS and PAIN CONTROL At your preoperative visit for you history and physical you were given the following medications: An antibiotic: Start this medication when you get home and take according to the instructions on the bottle. Zofran 4 mg:  This is to treat nausea and vomiting.  You can take this every 6 hours as needed and only if needed. Norco (hydrocodone/acetaminophen) 5/325 mg:  This is only to be used after you have taken the motrin or the tylenol. Every 8 hours as needed. Over the counter Medication to take: Ibuprofen (Motrin) 600 mg:  Take this every 6 hours.  If you have additional pain then take 500 mg of the tylenol.  Only take the Norco after you have tried these two. Miralax or stool softener of choice: Take this according to the bottle if you take the Norco.  WHEN TO CALL Call your surgeon's office if any of the following occur:  Fever 101  degrees F or greater  Excessive bleeding or fluid from the incision site.  Pain that increases over time without aid from the medications  Redness, warmth, or pus draining from incision sites  Persistent nausea or inability to take in liquids  Severe misshapen area that underwent the operation.  You had 5 mg of Oxycodone at 2:07 PM.

## 2021-08-17 ENCOUNTER — Encounter: Payer: Medicaid Other | Admitting: Surgical

## 2021-08-17 ENCOUNTER — Encounter (HOSPITAL_BASED_OUTPATIENT_CLINIC_OR_DEPARTMENT_OTHER): Payer: Self-pay | Admitting: Plastic Surgery

## 2021-08-17 LAB — NICOTINE/COTININE METABOLITES
Cotinine: 6.1 ng/mL
Nicotine: <1 ng/mL

## 2021-08-17 NOTE — Anesthesia Postprocedure Evaluation (Signed)
Anesthesia Post Note  Patient: Wendy Benitez  Procedure(s) Performed: BILATERAL BREAST REDUCTION WITH LIPOSUCTION (Bilateral: Breast)     Patient location during evaluation: PACU Anesthesia Type: General Level of consciousness: awake and alert Pain management: pain level controlled Vital Signs Assessment: post-procedure vital signs reviewed and stable Respiratory status: spontaneous breathing, nonlabored ventilation and respiratory function stable Cardiovascular status: blood pressure returned to baseline and stable Postop Assessment: no apparent nausea or vomiting Anesthetic complications: no   No notable events documented.  Last Vitals:  Vitals:   08/16/21 1315 08/16/21 1417  BP: 111/75 117/84  Pulse: 74 67  Resp: 16 17  Temp:  (!) 35.9 C  SpO2: 100% 98%    Last Pain:  Vitals:   08/16/21 1354  TempSrc:   PainSc: 4                  Candra R Annison Birchard

## 2021-08-20 LAB — SURGICAL PATHOLOGY

## 2021-08-20 NOTE — Addendum Note (Signed)
Addendum  created 08/20/21 0644 by Alford Highland, CRNA   Charge Capture section accepted

## 2021-08-24 ENCOUNTER — Encounter: Payer: Self-pay | Admitting: Plastic Surgery

## 2021-08-24 ENCOUNTER — Other Ambulatory Visit: Payer: Self-pay

## 2021-08-24 ENCOUNTER — Ambulatory Visit (INDEPENDENT_AMBULATORY_CARE_PROVIDER_SITE_OTHER): Payer: Medicaid Other | Admitting: Plastic Surgery

## 2021-08-24 DIAGNOSIS — N62 Hypertrophy of breast: Secondary | ICD-10-CM

## 2021-08-24 NOTE — Progress Notes (Signed)
The patient is a 20 year old female here for follow-up on her bilateral breast reduction.  Overall she is doing really well.  Her Steri-Strips are still in place.  Her honeycomb's are off.  She is very happy with her size.  There is no sign of hematoma or seroma.  She has quite a bit of swelling and a little bit of bruising as expected.  No signs of infection.  She can go into a sports bra.  We will see her back in 2 weeks.

## 2021-08-31 ENCOUNTER — Encounter: Payer: Medicaid Other | Admitting: Plastic Surgery

## 2021-09-07 ENCOUNTER — Ambulatory Visit (INDEPENDENT_AMBULATORY_CARE_PROVIDER_SITE_OTHER): Payer: Medicaid Other | Admitting: Surgical

## 2021-09-07 ENCOUNTER — Other Ambulatory Visit: Payer: Self-pay

## 2021-09-07 ENCOUNTER — Encounter: Payer: Medicaid Other | Admitting: Plastic Surgery

## 2021-09-07 DIAGNOSIS — N62 Hypertrophy of breast: Secondary | ICD-10-CM

## 2021-09-07 DIAGNOSIS — M546 Pain in thoracic spine: Secondary | ICD-10-CM

## 2021-09-07 DIAGNOSIS — G8929 Other chronic pain: Secondary | ICD-10-CM

## 2021-09-07 DIAGNOSIS — M542 Cervicalgia: Secondary | ICD-10-CM

## 2021-09-07 NOTE — Progress Notes (Signed)
Patient is a 20 year old female s/p bilateral breast reduction with liposuction performed 08/16/2021 by Dr. Ulice Bold who presents to the office for postoperative follow-up.  She is overall doing well.  She has been wearing a Bando instead of a sports bra, she does not feel as if it is very compressive.  She is not having any infectious symptoms.  She has some questions about suture knots and nipple areolar position.  She has some questions about if with healing and time the nipple areolar positions will be more similar, she feels as if the right nipple areola is pointing more upward and the left is pointing more downward.  Chaperone present on exam On exam bilateral NAC's are viable, she has 2 small wounds at the junction of the vertical limb and inframammary fold of both breasts.  These wounds are approximately 2 x 2 mm.  There is no erythema or cellulitic changes.  She does have some subcutaneous fluid collections noted with palpation.  There is minimal tension on the incisions.  I do not appreciate any overlying skin changes.  In regards to the nipple areolar complex positions, I do appreciate the differences she is noticing.  The right NAC is pointing anymore forward and upward direction and the left NAC is pointing in a more downward forward position.  The right breast is more full and likely has a little bit more of a fluid collection, so this may be contributing to that difference.  Recommend Vaseline and gauze to bilateral inframammary fold wounds.  Recommend switching to a more compressive bra to help decrease fluid collection.  Given the minimal tension on the incisions, do not think a needle aspiration in the office was necessary at this time.  We discussed continuing to watch the nipple areolar position for improvement after the subcutaneous fluid collections are absorbable.  Recommend following up in 4 weeks for reevaluation.  Pictures were taken and placed in the patient's chart with patient's  permission.

## 2021-10-08 NOTE — Progress Notes (Signed)
Patient is a 20 year old female s/p bilateral breast reduction with liposuction performed 08/16/2021 by Dr. Ulice Bold who presents to the office for postoperative follow-up.  She was last seen here in clinic on 09/07/2021.  At that time, she complained that her right nipple is more upward pointing relative to her left nipple which is angled more downward.  Exam was notable for 2 small 2 mm wounds at the junction of the vertical limb and inframammary fold bilaterally.  Otherwise, largely reassuring.  Recommended Vaseline and gauze to the wounds and switching to a more compressive bra to help decrease fluid collection.  No needle aspiration was attempted as there was lower suspicion for large seroma.  Pictures were obtained and placed in chart.  Today, patient is doing well.  She states that the wounds have improved.  Patient does endorse mildly diminished sensation over left axillary region.  She has been wearing a more compressive bra, as directed.  Denies any redness, fevers, worsening pain symptoms, or other symptoms.  Physical exam is entirely reassuring.  Breasts with excellent shape and symmetry.  Patient is very pleased with the surgical outcome and is happy that she made decision to have breast reduction surgery.  She already reports improvement in back and neck discomfort.  No areas of dehiscence or persistent wounds noted.  No subcutaneous fluid collections noted on exam.  Left NAC still very minimally downward pointing compared to right side, but improved compared to prior photo.  Anticipate continued improvement/settling.  Discussed scar treatment with patient.  She will proceed with either Skinuva or Mederma.  She can transition to a normal bra.  We will continue to avoid underwire bras as well as any hot tub/pool/bathtub submersion for at least 3 months.  While there is no specific follow-up needed at this point, she understands that she can always call the clinic should she develop any  questions or concerns.  Picture(s) obtained of the patient and placed in the chart were with the patient's or guardian's permission.

## 2021-10-09 ENCOUNTER — Ambulatory Visit (INDEPENDENT_AMBULATORY_CARE_PROVIDER_SITE_OTHER): Payer: Medicaid Other | Admitting: Physician Assistant

## 2021-10-09 ENCOUNTER — Other Ambulatory Visit: Payer: Self-pay

## 2021-10-09 DIAGNOSIS — Z9889 Other specified postprocedural states: Secondary | ICD-10-CM

## 2021-11-13 ENCOUNTER — Ambulatory Visit
Admission: EM | Admit: 2021-11-13 | Discharge: 2021-11-13 | Disposition: A | Discharge status: Home / Self Care | Payer: Medicaid Other | Attending: Physician Assistant | Admitting: Physician Assistant

## 2021-11-13 ENCOUNTER — Other Ambulatory Visit: Payer: Self-pay

## 2021-11-13 DIAGNOSIS — Z1152 Encounter for screening for COVID-19: Secondary | ICD-10-CM | POA: Diagnosis not present

## 2021-11-13 DIAGNOSIS — J069 Acute upper respiratory infection, unspecified: Secondary | ICD-10-CM

## 2021-11-13 MED ORDER — OSELTAMIVIR PHOSPHATE 75 MG PO CAPS: 75.0000 mg | ORAL_CAPSULE | Freq: Two times a day (BID) | ORAL | 0 refills | Status: AC

## 2021-11-13 MED ORDER — ACETAMINOPHEN 325 MG PO TABS
650.0000 mg | ORAL_TABLET | Freq: Once | ORAL | Status: AC
  Administered 2021-11-13: 650 mg via ORAL

## 2021-11-13 NOTE — ED Triage Notes (Signed)
Pt presents with bodyaches, HA, fever/chills, sore throat, weakness x 1 day.  No known exposure.  Took Tylenol and cold med last night but nothing today.

## 2021-11-13 NOTE — ED Provider Notes (Signed)
[facility]    CSN: [id number] Arrival date & time: 11/13/21  0836      History   Chief Complaint Chief Complaint  Patient presents with   Fever    HPI Wendy Benitez is a 21 y.o. female.   Patient here today for evaluation of  body aches, headaches, fever, sore throat, weakness for one day. She has not had known exposure. He took tylenol and cold med last night but no meds today. Symptoms have not resolved.  The history is provided by the patient.   Past Medical History:  Diagnosis Date   Asthma     Patient Active Problem List   Diagnosis Date Noted   Symptomatic mammary hypertrophy 01/02/2021   Back pain 01/02/2021   Neck pain 01/02/2021    Past Surgical History:  Procedure Laterality Date   BREAST REDUCTION SURGERY Bilateral 08/16/2021   Procedure: BILATERAL BREAST REDUCTION WITH LIPOSUCTION;  Surgeon: Peggye Form, DO;  Location: [facility];  Service: Plastics;  Laterality: Bilateral;   HERNIA REPAIR      OB History   No obstetric history on file.      Home Medications    Prior to Admission medications   Medication Sig Start Date End Date Taking? Authorizing Provider  medroxyPROGESTERone (DEPO-PROVERA) 150 MG/ML injection Inject 150 mg into the muscle every 3 (three) months.   Yes [provider]  oseltamivir (TAMIFLU) 75 MG capsule Take 1 capsule (75 mg total) by mouth every 12 (twelve) hours. 11/13/21  Yes Tomi Bamberger, PA-C  ondansetron (ZOFRAN) 4 MG tablet Take 1 tablet (4 mg total) by mouth every 8 (eight) hours as needed for nausea or vomiting. 08/16/21   Scheeler, Kermit Balo, PA-C    Family History History reviewed. No pertinent family history.  Social History Social History   Tobacco Use   Smoking status: Never   Smokeless tobacco: Never  Vaping Use   Vaping Use: Some days  Substance Use Topics   Alcohol use: Yes   Drug use: Never     Allergies   Patient has no known  allergies.   Review of Systems Review of Systems  Constitutional:  Positive for chills and fever.  HENT:  Positive for congestion and sore throat. Negative for ear pain.   Eyes:  Negative for discharge and redness.  Respiratory:  Positive for cough. Negative for shortness of breath and wheezing.   Gastrointestinal:  Positive for nausea. Negative for abdominal pain, diarrhea and vomiting.    Physical Exam Triage Vital Signs ED Triage Vitals  Enc Vitals Group     BP      Pulse      Resp      Temp      Temp src      SpO2      Weight      Height      Head Circumference      Peak Flow      Pain Score      Pain Loc      Pain Edu?      Excl. in GC?    No data found.  Updated Vital Signs BP 115/72 (BP Location: Left Arm)    Pulse (!) 120    Temp (!) 103 F (39.4 C) (Oral)    Resp 18    SpO2 96%      Physical Exam Vitals and nursing note reviewed.  Constitutional:      General: She is  not in acute distress.    Appearance: Normal appearance. She is not ill-appearing.  HENT:     Head: Normocephalic and atraumatic.     Nose: Congestion present.     Mouth/Throat:     Mouth: Mucous membranes are moist.     Pharynx: Posterior oropharyngeal erythema present. No oropharyngeal exudate.  Eyes:     Conjunctiva/sclera: Conjunctivae normal.  Cardiovascular:     Rate and Rhythm: Normal rate and regular rhythm.     Heart sounds: Normal heart sounds. No murmur heard. Pulmonary:     Effort: Pulmonary effort is normal. No respiratory distress.     Breath sounds: Normal breath sounds. No wheezing, rhonchi or rales.  Skin:    General: Skin is warm and dry.  Neurological:     Mental Status: She is alert.  Psychiatric:        Mood and Affect: Mood normal.        Thought Content: Thought content normal.     UC Treatments / Results  Labs (all labs ordered are listed, but only abnormal results are displayed) Labs Reviewed  COVID-19, FLU A+B NAA    EKG   Radiology No  results found.  Procedures Procedures (including critical care time)  Medications Ordered in UC Medications  acetaminophen (TYLENOL) tablet 650 mg (650 mg Oral Given 11/13/21 0911)    Initial Impression / Assessment and Plan / UC Course  I have reviewed the triage vital signs and the nursing notes.  Pertinent labs & imaging results that were available during my care of the patient were reviewed by me and considered in my medical decision making (see chart for details).   Covid and flu screening ordered. Will treat to cover influenza and recommend follow up with any further concerns. Encouraged increased fluids and symptomatic treatment if needed.   Final Clinical Impressions(s) / UC Diagnoses   Final diagnoses:  Encounter for screening for COVID-19  Acute upper respiratory infection   Discharge Instructions   None    ED Prescriptions     Medication Sig Dispense Auth. Provider   oseltamivir (TAMIFLU) 75 MG capsule Take 1 capsule (75 mg total) by mouth every 12 (twelve) hours. 10 capsule Tomi Bamberger, PA-C      PDMP not reviewed this encounter.   Tomi Bamberger, PA-C 11/13/21 [phone]

## 2021-11-15 ENCOUNTER — Encounter (HOSPITAL_COMMUNITY): Payer: Self-pay

## 2021-11-15 ENCOUNTER — Emergency Department (HOSPITAL_COMMUNITY)
Admission: EM | Admit: 2021-11-15 | Discharge: 2021-11-15 | Disposition: A | Discharge status: Home / Self Care | Payer: Medicaid Other | Attending: Emergency Medicine | Admitting: Emergency Medicine

## 2021-11-15 DIAGNOSIS — J069 Acute upper respiratory infection, unspecified: Secondary | ICD-10-CM | POA: Diagnosis not present

## 2021-11-15 DIAGNOSIS — J029 Acute pharyngitis, unspecified: Secondary | ICD-10-CM | POA: Diagnosis present

## 2021-11-15 DIAGNOSIS — Z20822 Contact with and (suspected) exposure to covid-19: Secondary | ICD-10-CM | POA: Insufficient documentation

## 2021-11-15 LAB — COVID-19, FLU A+B NAA
Influenza A, NAA: NOT DETECTED
Influenza B, NAA: NOT DETECTED
SARS-CoV-2, NAA: NOT DETECTED

## 2021-11-15 LAB — RESP PANEL BY RT-PCR (FLU A&B, COVID) ARPGX2
Influenza A by PCR: NEGATIVE
Influenza B by PCR: NEGATIVE
SARS Coronavirus 2 by RT PCR: NEGATIVE

## 2021-11-15 LAB — GROUP A STREP BY PCR: Group A Strep by PCR: NOT DETECTED

## 2021-11-15 NOTE — ED Notes (Signed)
I provided reinforced discharge education based off of discharge instructions. Pt acknowledged and understood my education. Pt had no further questions/concerns for provider/myself.  °

## 2021-11-15 NOTE — Discharge Instructions (Signed)
Your work-up today was reassuring for acute abnormalities.  Your strep and COVID and flu swab were all negative.  However, I still suspect that you have an upper respiratory infection.  These are almost always viral in nature and therefore antibiotics are not indicated.  Please manage with supportive care including Cepacol throat lozenges for sore throat, and Tylenol/ibuprofen as needed for fevers.  Return if development of any new or worsening symptoms.

## 2021-11-15 NOTE — ED Provider Notes (Signed)
[facility]-EMERGENCY DEPT Provider Note   CSN: [id number] Arrival date & time: 11/15/21  1235     History  No chief complaint on file.   Wendy Benitez is a 21 y.o. female.  Patient with no pertinent past medical history presents today with chief complaint of sore throat and fevers.  She states her symptoms have been ongoing since Sunday.  She was seen at urgent care on Tuesday and swabbed for COVID and the flu, however did not result in the office.  They prescribed her Tamiflu which she has been taking.  She states that she was having body aches, however those have resolved.  She states she has had intermittent fevers around 89 for which she has been taking Tylenol and Excedrin with relief.  She presents today because her sore throat is worse.  She is still able to swallow with minimal difficulty.  She denies any voice changes.  The history is provided by the patient. No language interpreter was used.      Home Medications Prior to Admission medications   Medication Sig Start Date End Date Taking? Authorizing Provider  Acetaminophen 500 MG capsule Take 500-1,000 mg by mouth every 6 (six) hours as needed for fever or pain.   Yes [provider]  EXCEDRIN EXTRA STRENGTH [phone] MG tablet Take 1-2 tablets by mouth every 6 (six) hours as needed for headache (and/or discomfort).   Yes [provider]  medroxyPROGESTERone (DEPO-PROVERA) 150 MG/ML injection Inject 150 mg into the muscle every 3 (three) months.   Yes [provider]  ondansetron (ZOFRAN) 4 MG tablet Take 1 tablet (4 mg total) by mouth every 8 (eight) hours as needed for nausea or vomiting. 08/16/21  Yes Scheeler, Kermit Balo, PA-C  oseltamivir (TAMIFLU) 75 MG capsule Take 1 capsule (75 mg total) by mouth every 12 (twelve) hours. 11/13/21  Yes Tomi Bamberger, PA-C  Pseudoephedrine-DM-GG-APAP (THERAFLU MAX-D COLD & FLU PO) Take 15-30 mLs by mouth every 6 (six) hours as needed (for  flu-like symptoms).   Yes [provider]      Allergies    Patient has no known allergies.    Review of Systems   Review of Systems  Constitutional:  Positive for fever. Negative for chills.  HENT:  Positive for congestion and sore throat. Negative for rhinorrhea, trouble swallowing and voice change.   Respiratory:  Negative for cough and shortness of breath.   Gastrointestinal:  Negative for diarrhea, nausea and vomiting.  Skin:  Negative for rash.  All other systems reviewed and are negative.  Physical Exam Updated Vital Signs BP 116/68 (BP Location: Left Arm)    Pulse (!) 104    Temp 98.8 F (37.1 C) (Oral)    Resp 16    LMP 11/12/2021 (Approximate)    SpO2 100%  Physical Exam Vitals and nursing note reviewed.  Constitutional:      General: She is not in acute distress.    Appearance: Normal appearance. She is normal weight. She is not ill-appearing, toxic-appearing or diaphoretic.     Comments: Patient sitting comfortably in chair talking on the phone in no acute distress  HENT:     Head: Normocephalic and atraumatic.     Nose: Nose normal.     Mouth/Throat:     Lips: Pink.     Mouth: Mucous membranes are moist.     Pharynx: No oropharyngeal exudate or posterior oropharyngeal erythema.     Tonsils: Tonsillar exudate present. No  tonsillar abscesses. 2+ on the right. 2+ on the left.  Cardiovascular:     Rate and Rhythm: Normal rate and regular rhythm.     Heart sounds: Normal heart sounds.  Pulmonary:     Effort: Pulmonary effort is normal. No respiratory distress.     Breath sounds: Normal breath sounds. No stridor. No wheezing, rhonchi or rales.  Musculoskeletal:        General: Normal range of motion.  Skin:    General: Skin is warm and dry.  Neurological:     Mental Status: She is alert.    ED Results / Procedures / Treatments   Labs (all labs ordered are listed, but only abnormal results are displayed) Labs Reviewed  RESP PANEL BY RT-PCR (FLU A&B,  COVID) [id number]  GROUP A STREP BY PCR    EKG None  Radiology No results found.  Procedures Procedures    Medications Ordered in ED Medications - No data to display  ED Course/ Medical Decision Making/ A&P                           Medical Decision Making  Patient presents today with URI symptoms.  Negative for strep, COVID, and the flu today.  She is able to swallow without difficulty.  No signs of PTA or RPA.  Patients symptoms are consistent with URI, likely viral etiology. Discussed that antibiotics are not indicated for viral infections. Pt will be discharged with symptomatic treatment including Cepacol for sore throat and Tylenol/ibuprofen for fevers.  Verbalizes understanding and is agreeable with plan, educated on red flag symptoms of prompt immediate return.  She is afebrile, nontoxic-appearing, and in no acute distress with reassuring vital signs.  Discharged in stable condition.  Final Clinical Impression(s) / ED Diagnoses Final diagnoses:  Viral upper respiratory tract infection    Rx / DC Orders ED Discharge Orders     None     An After Visit Summary was printed and given to the patient.     Vear Clock 11/15/21 1623    Lorre Nick, MD 11/19/21 1014

## 2021-11-15 NOTE — ED Triage Notes (Addendum)
Pt arrived c/o bodyaches, HA, fever/chills, congestion, sore throat, fatigue that started Sunday.   Pt reports going to the UC on Tuesday and had covid/flu test that is not resulted yet. Pt was given Oseltamvir 75mg  and reports she no longer has bodyaches.    A/Ox4 Ambulatory in triage   8/10 sore throat

## 2022-05-17 IMAGING — MG MM DIGITAL SCREENING BILAT W/ TOMO AND CAD
6 of 10 series · 6 of 30 positions shown · non-contrast
Comparison: None.

CLINICAL DATA: Screening.

EXAM:
DIGITAL SCREENING BILATERAL MAMMOGRAM WITH TOMOSYNTHESIS AND CAD
TECHNIQUE: Bilateral screening digital craniocaudal and mediolateral oblique
mammograms were obtained. Bilateral screening digital breast
tomosynthesis was performed. The images were evaluated with
computer-aided detection.

[R CC synth-2D]
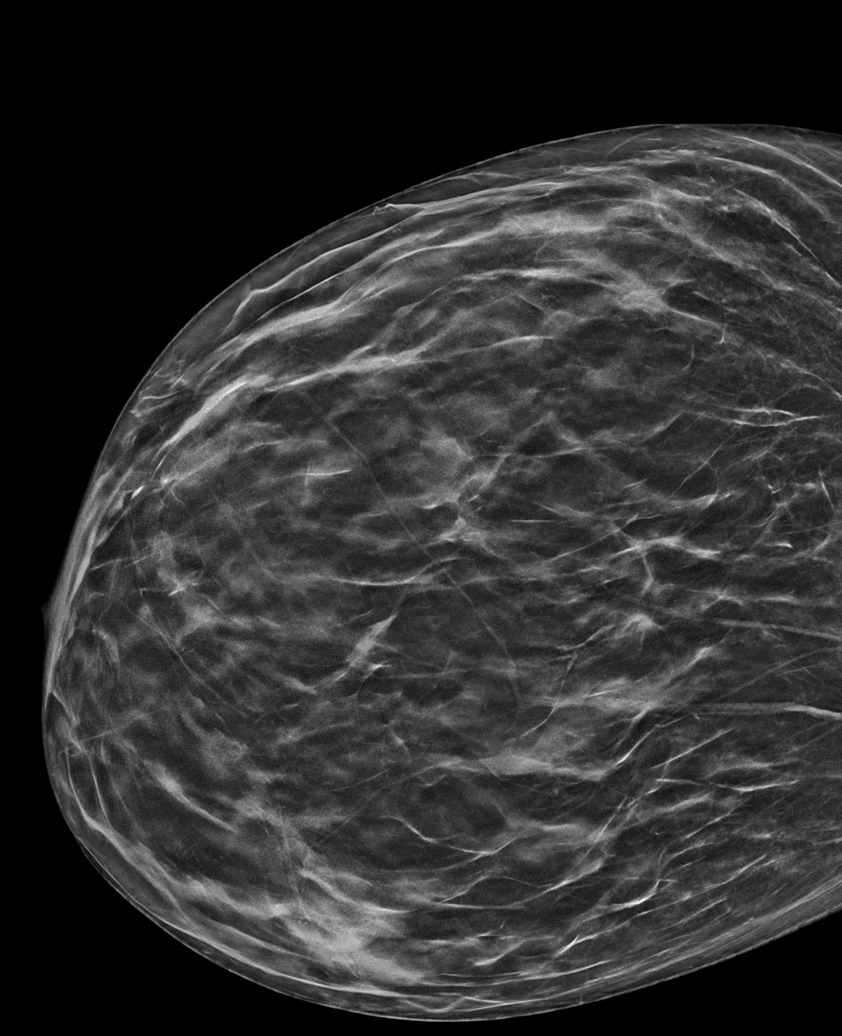

[L MLO synth-2D (1 of 2)]
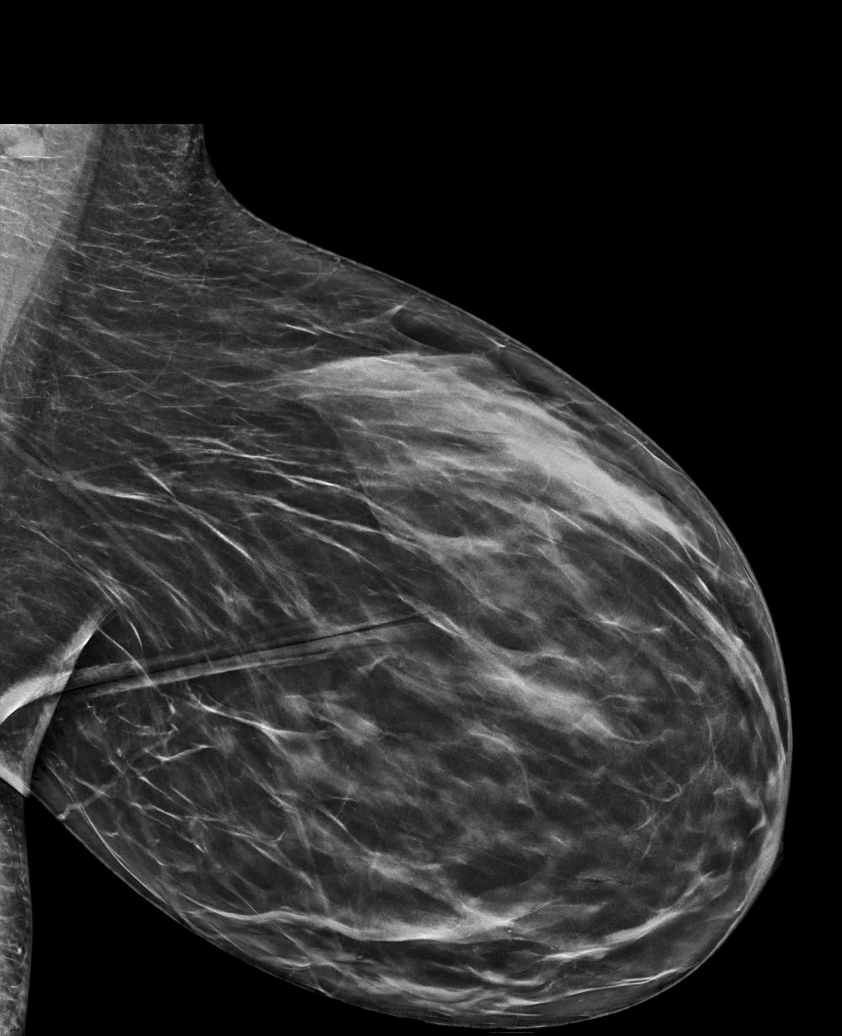

[L MLO synth-2D (2 of 2)]
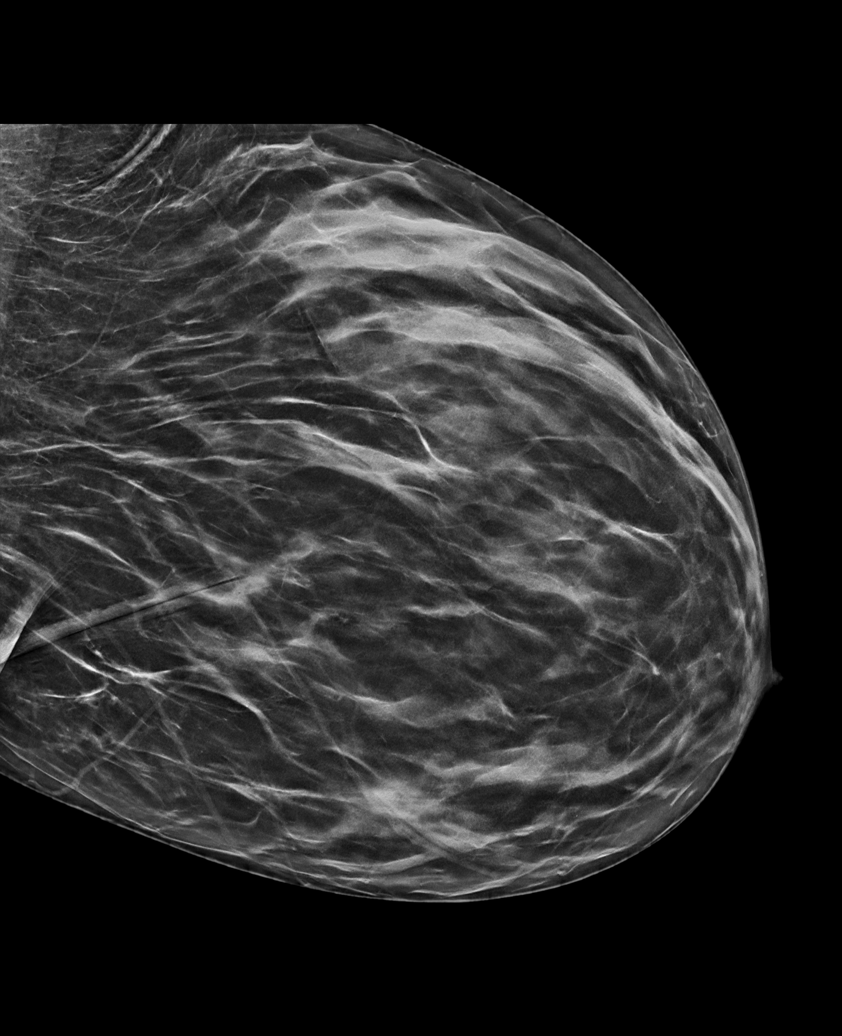

[L CC synth-2D]
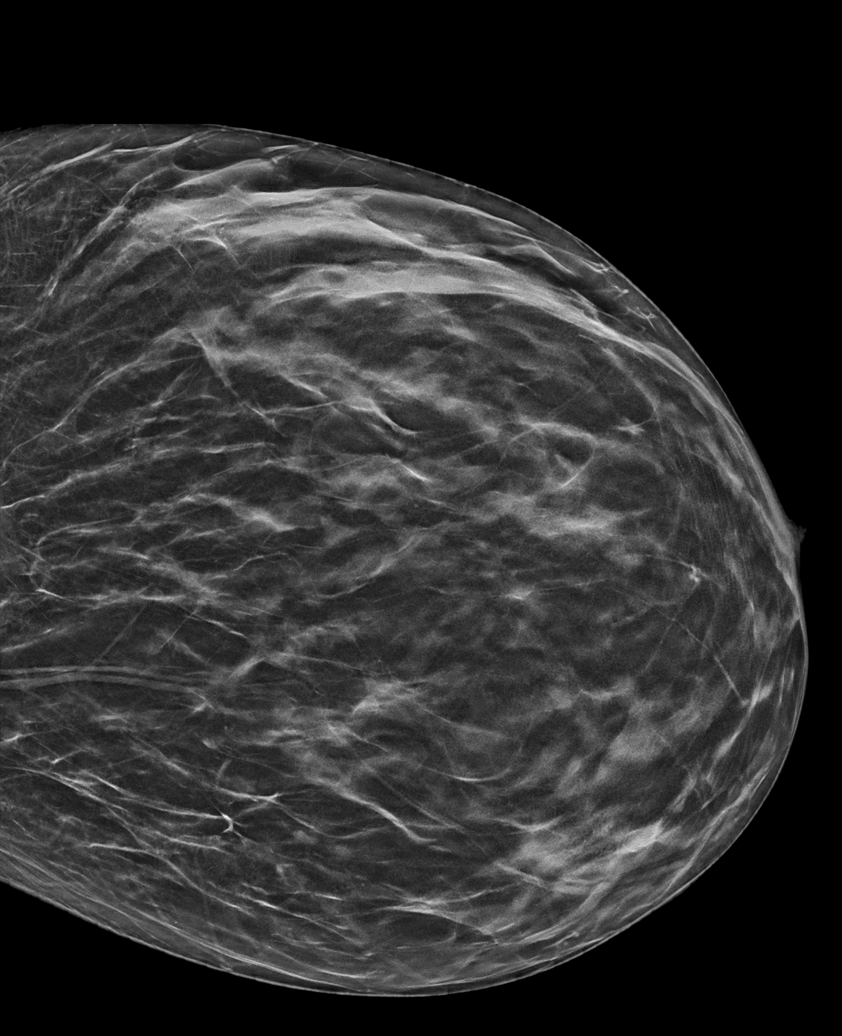

[R MLO synth-2D]
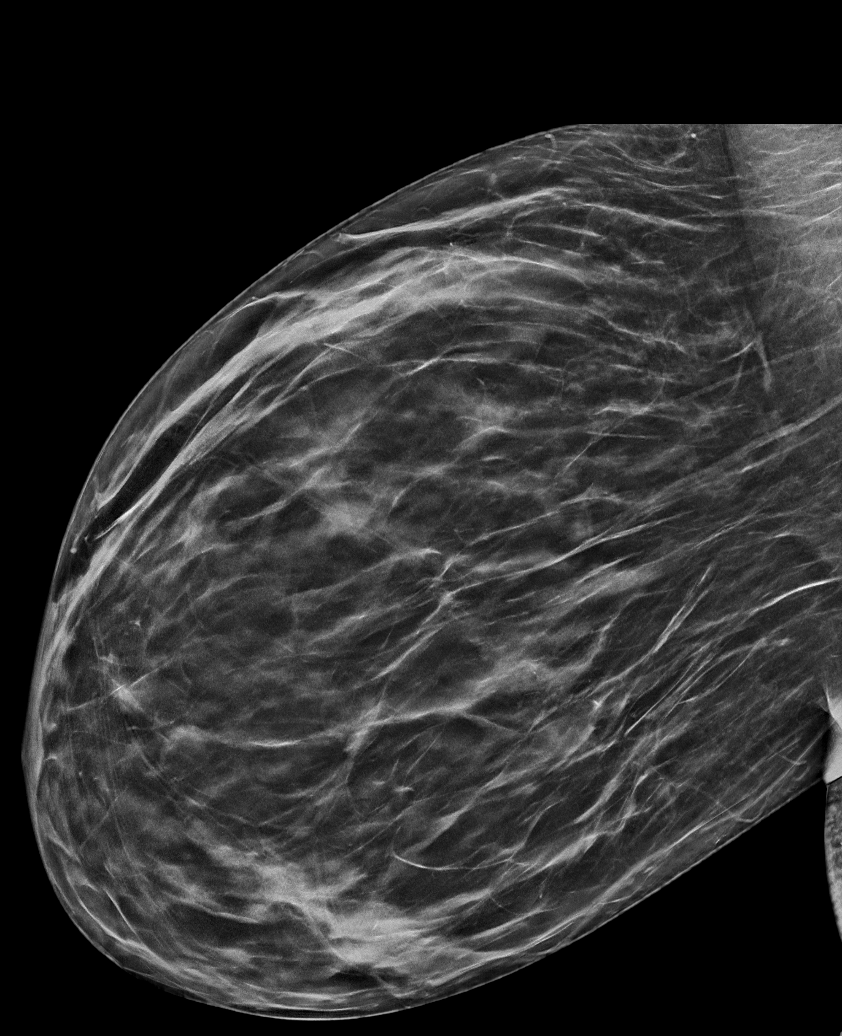

[L MLO tomo · tomo slice 33/65.0]
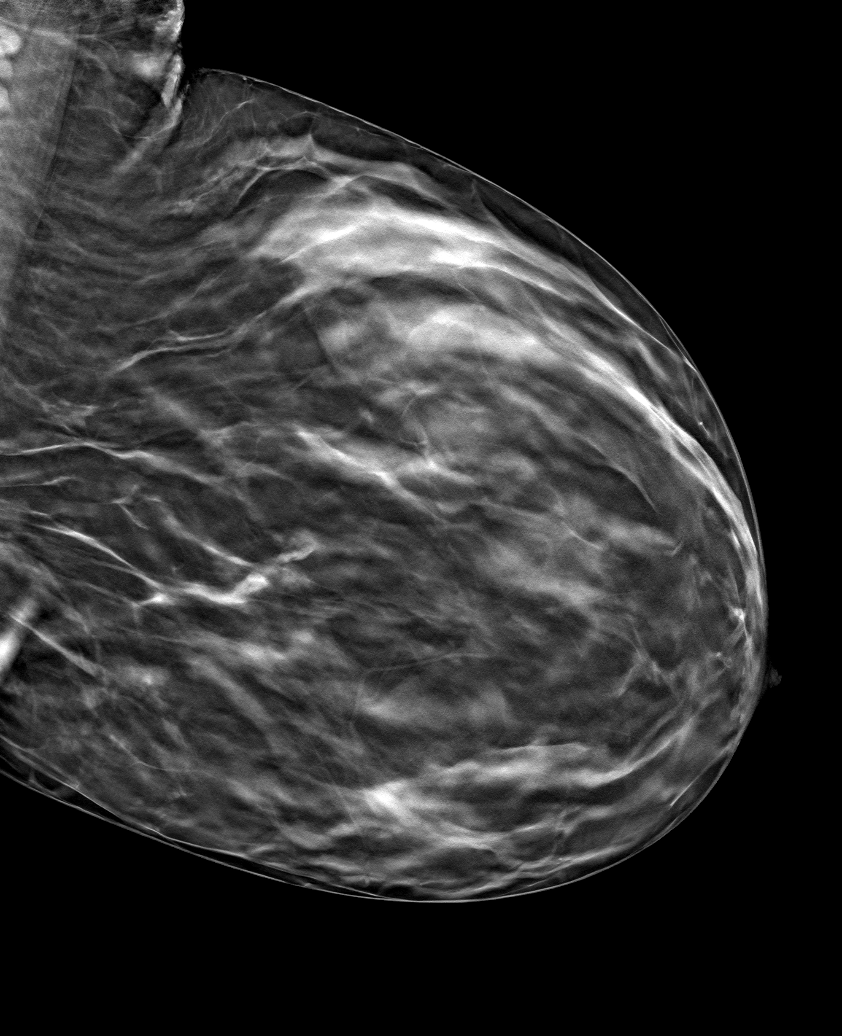

[6 of 30 positions shown; findings below may reference images not displayed]

ACR Breast Density Category c: The breast tissue is heterogeneously
dense, which may obscure small masses
FINDINGS: There are no findings suspicious for malignancy.
IMPRESSION: No mammographic evidence of malignancy. A result letter of this
screening mammogram will be mailed directly to the patient.

RECOMMENDATION:
Screening mammogram at age 40. (Code:[id number]-0-79Z)

BI-RADS CATEGORY  1: Negative.

## 2022-10-17 ENCOUNTER — Encounter: Payer: Self-pay | Admitting: Emergency Medicine

## 2022-10-17 ENCOUNTER — Ambulatory Visit
Admission: EM | Admit: 2022-10-17 | Discharge: 2022-10-17 | Disposition: A | Discharge status: Home / Self Care | Payer: Medicaid Other | Attending: Internal Medicine | Admitting: Internal Medicine

## 2022-10-17 DIAGNOSIS — J029 Acute pharyngitis, unspecified: Secondary | ICD-10-CM | POA: Diagnosis present

## 2022-10-17 DIAGNOSIS — J069 Acute upper respiratory infection, unspecified: Secondary | ICD-10-CM | POA: Insufficient documentation

## 2022-10-17 LAB — POCT RAPID STREP A (OFFICE): Rapid Strep A Screen: NEGATIVE

## 2022-10-17 MED ORDER — AMOXICILLIN 500 MG PO CAPS: 500.0000 mg | ORAL_CAPSULE | Freq: Two times a day (BID) | ORAL | 0 refills | Status: AC

## 2022-10-17 NOTE — Discharge Instructions (Signed)
Your strep test was negative but I am highly suspicious of strep throat and/or upper respiratory infection so I am treating you with an antibiotic.  Please follow-up if symptoms persist or worsen.

## 2022-10-17 NOTE — ED Provider Notes (Signed)
[facility]    CSN: [id number] Arrival date & time: 10/17/22  1258      History   Chief Complaint Chief Complaint  Patient presents with   Cough   Nasal Congestion   Headache   Sore Throat    HPI Wendy Benitez is a 21 y.o. female.   Patient presents with cough, nasal congestion, sore throat, headache that has been present for about 7 days.  Patient reports that symptoms have seemed to worsen today.  She denies any known sick contacts or fever at home.  Has taken over-the-counter cold and flu medications with minimal improvement in symptoms.  Patient reports history of asthma in childhood with no complications since.  Denies chest pain, shortness of breath, ear pain, nausea, vomiting, diarrhea, abdominal pain.   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      Home Medications    Prior to Admission medications   Medication Sig Start Date End Date Taking? Authorizing Provider  amoxicillin (AMOXIL) 500 MG capsule Take 1 capsule (500 mg total) by mouth 2 (two) times daily for 10 days. 10/17/22 10/27/22 Yes Ervin Knack E, FNP  Acetaminophen 500 MG capsule Take 500-1,000 mg by mouth every 6 (six) hours as needed for fever or pain.    [provider]  EXCEDRIN EXTRA STRENGTH [phone] MG tablet Take 1-2 tablets by mouth every 6 (six) hours as needed for headache (and/or discomfort).    [provider]   medroxyPROGESTERone (DEPO-PROVERA) 150 MG/ML injection Inject 150 mg into the muscle every 3 (three) months.    [provider]  ondansetron (ZOFRAN) 4 MG tablet Take 1 tablet (4 mg total) by mouth every 8 (eight) hours as needed for nausea or vomiting. 08/16/21   Scheeler, Kermit Balo, PA-C  oseltamivir (TAMIFLU) 75 MG capsule Take 1 capsule (75 mg total) by mouth every 12 (twelve) hours. 11/13/21   Tomi Bamberger, PA-C  Pseudoephedrine-DM-GG-APAP (THERAFLU MAX-D COLD & FLU PO) Take 15-30 mLs by mouth every 6 (six) hours as needed (for flu-like symptoms).    [provider]    Family History History reviewed. No pertinent family history.  Social History Social History   Tobacco Use   Smoking status: Never   Smokeless tobacco: Never  Vaping Use   Vaping Use: Some days  Substance Use Topics   Alcohol use: Yes   Drug use: Never     Allergies   Patient has no known allergies.   Review of Systems Review of Systems Per HPI  Physical Exam Triage Vital Signs ED Triage Vitals  Enc Vitals Group     BP 10/17/22 1410 131/83     Pulse Rate 10/17/22 1410 81     Resp 10/17/22 1410 18     Temp 10/17/22 1410 98.2 F (36.8 C)     Temp src --      SpO2 10/17/22 1410 99 %  Weight --      Height --      Head Circumference --      Peak Flow --      Pain Score 10/17/22 1437 0     Pain Loc --      Pain Edu? --      Excl. in GC? --    No data found.  Updated Vital Signs BP 131/83   Pulse 81   Temp 98.2 F (36.8 C)   Resp 18   SpO2 99%   Visual Acuity Right Eye Distance:   Left Eye Distance:   Bilateral Distance:    Right Eye Near:   Left Eye Near:    Bilateral Near:     Physical Exam Constitutional:      General: She is not in acute distress.    Appearance: Normal appearance. She is not toxic-appearing or diaphoretic.  HENT:     Head: Normocephalic and atraumatic.     Right Ear: Tympanic membrane and ear canal normal.     Left Ear: Tympanic  membrane and ear canal normal.     Nose: Congestion present.     Mouth/Throat:     Mouth: Mucous membranes are moist.     Pharynx: Posterior oropharyngeal erythema present. No pharyngeal swelling, oropharyngeal exudate or uvula swelling.     Tonsils: Tonsillar exudate present. No tonsillar abscesses. 1+ on the right. 1+ on the left.  Eyes:     Extraocular Movements: Extraocular movements intact.     Conjunctiva/sclera: Conjunctivae normal.     Pupils: Pupils are equal, round, and reactive to light.  Cardiovascular:     Rate and Rhythm: Normal rate and regular rhythm.     Pulses: Normal pulses.     Heart sounds: Normal heart sounds.  Pulmonary:     Effort: Pulmonary effort is normal. No respiratory distress.     Breath sounds: Normal breath sounds. No stridor. No wheezing, rhonchi or rales.  Abdominal:     General: Abdomen is flat. Bowel sounds are normal.     Palpations: Abdomen is soft.  Musculoskeletal:        General: Normal range of motion.     Cervical back: Normal range of motion.  Skin:    General: Skin is warm and dry.  Neurological:     General: No focal deficit present.     Mental Status: She is alert and oriented to person, place, and time. Mental status is at baseline.  Psychiatric:        Mood and Affect: Mood normal.        Behavior: Behavior normal.      UC Treatments / Results  Labs (all labs ordered are listed, but only abnormal results are displayed) Labs Reviewed  CULTURE, GROUP A STREP [facility])  POCT RAPID STREP A (OFFICE)    EKG   Radiology No results found.  Procedures Procedures (including critical care time)  Medications Ordered in UC Medications - No data to display  Initial Impression / Assessment and Plan / UC Course  I have reviewed the triage vital signs and the nursing notes.  Pertinent labs & imaging results that were available during my care of the patient were reviewed by me and considered in my medical decision making (see  chart for details).     Strep is negative but I am still highly suspicious of strep throat given appearance of posterior pharynx on exam.  Therefore, will opt to treat with amoxicillin antibiotic.  Patient  also reporting that symptoms are worsening which is concerning for secondary bacterial infection.  Other differential diagnoses include persistent viral infection.  Therefore, discussed supportive cares and symptom management with patient.  Patient advised to follow-up if symptoms persist or worsen.  Patient verbalized understanding and was agreeable with plan. Final Clinical Impressions(s) / UC Diagnoses   Final diagnoses:  Acute upper respiratory infection  Sore throat     Discharge Instructions      Your strep test was negative but I am highly suspicious of strep throat and/or upper respiratory infection so I am treating you with an antibiotic.  Please follow-up if symptoms persist or worsen.    ED Prescriptions     Medication Sig Dispense Auth. Provider   amoxicillin (AMOXIL) 500 MG capsule Take 1 capsule (500 mg total) by mouth 2 (two) times daily for 10 days. 20 capsule Gustavus Bryant, [place]      PDMP not reviewed this encounter.   Gustavus Bryant, [place] 10/17/22 [id number]

## 2022-10-17 NOTE — ED Triage Notes (Signed)
Pt is present today with c/o cough, congestion, sore throat, and HA x1 week

## 2022-10-18 LAB — CULTURE, GROUP A STREP (THRC)

## 2024-10-18 ENCOUNTER — Encounter: Payer: Self-pay | Admitting: *Deleted

## 2024-10-18 ENCOUNTER — Ambulatory Visit: Admission: EM | Admit: 2024-10-18 | Discharge: 2024-10-18 | Disposition: A | Discharge status: Home / Self Care

## 2024-10-18 DIAGNOSIS — S91212A Laceration without foreign body of left great toe with damage to nail, initial encounter: Secondary | ICD-10-CM

## 2024-10-18 DIAGNOSIS — S91209A Unspecified open wound of unspecified toe(s) with damage to nail, initial encounter: Secondary | ICD-10-CM

## 2024-10-18 NOTE — Discharge Instructions (Addendum)
 You have been diagnosed with an abrasion/laceration that requires wound care.  -Use you have the area clean and dry -Change your bandage at least daily, keep bandage clean and dry.  If bandages become soiled or wet they need to be changed soon as possible. -Will get wound daily, if you notice color discharge, foul odor, or pale/gray/black discoloration of skin, increased redness, pain, or swelling you need to seek attention immediately. -You may use ibuprofen and Tylenol as needed for pain control.

## 2024-10-18 NOTE — ED Triage Notes (Signed)
 Pt reports left big toenail is lifting up. She went to the nail salon 3 weeks ago and had a green circle on her nail. They changed her polish and put some acrylic on the nail. States she put some glue on the nail yesterday. States it hurts when she puts pressure on the nail

## 2024-10-18 NOTE — ED Provider Notes (Signed)
 [facility]    CSN: [id number] Arrival date & time: 10/18/24  1114      History   Chief Complaint Chief Complaint  Patient presents with   Wound Check    HPI Wendy Benitez is a 23 y.o. female.   Pt presents today due an avulsion of left great toe nail that happened about 2 weeks ago. Pt states that she injured her foot while dancing. Pt states that it did not hurt at that time. Pt states that here entire toe nail has lifted up and she used nail glue to keep nail in place. Pt states that she woke up this morning with a portion of nail matrix visible with natural nail green in color. Pt states that she uses gel polish on toe nails.   The history is provided by the patient.  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      Home Medications    Prior to Admission medications   Medication Sig Start Date End Date Taking? Authorizing Provider  medroxyPROGESTERone (DEPO-PROVERA) 150 MG/ML injection Inject 150 mg into the muscle every 3 (three) months.   Yes [provider]  Acetaminophen  500 MG capsule Take 500-1,000 mg by mouth every 6 (six) hours as needed for fever or pain.    [provider]  EXCEDRIN EXTRA STRENGTH [phone] MG tablet Take 1-2 tablets by mouth every 6 (six) hours as needed for headache (and/or discomfort).    [provider]  ondansetron  (ZOFRAN ) 4 MG tablet Take 1 tablet (4 mg total) by mouth every 8 (eight)  hours as needed for nausea or vomiting. 08/16/21   Scheeler, Donnice PARAS, PA-C  oseltamivir  (TAMIFLU ) 75 MG capsule Take 1 capsule (75 mg total) by mouth every 12 (twelve) hours. 11/13/21   Billy Asberry FALCON, PA-C  Pseudoephedrine-DM-GG-APAP (THERAFLU MAX-D COLD & FLU PO) Take 15-30 mLs by mouth every 6 (six) hours as needed (for flu-like symptoms).    [provider]    Family History History reviewed. No pertinent family history.  Social History Social History   Tobacco Use   Smoking status: Never   Smokeless tobacco: Never  Vaping Use   Vaping status: Former  Substance Use Topics   Alcohol use: Yes   Drug use: Never     Allergies   Patient has no known allergies.   Review of Systems Review of Systems   Physical Exam Triage Vital Signs ED Triage Vitals  Encounter Vitals Group     BP 10/18/24 1205 112/75     Girls Systolic BP Percentile --      Girls Diastolic BP Percentile --      Boys Systolic BP Percentile --      Boys Diastolic BP Percentile --      Pulse Rate 10/18/24 1205 85     Resp 10/18/24 1205 16     Temp 10/18/24 1205 97.9 F (36.6 C)  Temp Source 10/18/24 1205 Oral     SpO2 10/18/24 1205 99 %     Weight --      Height --      Head Circumference --      Peak Flow --      Pain Score 10/18/24 1203 4     Pain Loc --      Pain Education --      Exclude from Growth Chart --    No data found.  Updated Vital Signs BP 112/75 (BP Location: Left Arm)   Pulse 85   Temp 97.9 F (36.6 C) (Oral)   Resp 16   LMP  (LMP Unknown)   SpO2 99%   Visual Acuity Right Eye Distance:   Left Eye Distance:   Bilateral Distance:    Right Eye Near:   Left Eye Near:    Bilateral Near:     Physical Exam Vitals and nursing note reviewed.  Constitutional:      General: She is not in acute distress.    Appearance: Normal appearance. She is not ill-appearing, toxic-appearing or diaphoretic.  Eyes:     General: No scleral icterus. Cardiovascular:      Rate and Rhythm: Normal rate and regular rhythm.     Heart sounds: Normal heart sounds.  Pulmonary:     Effort: Pulmonary effort is normal. No respiratory distress.     Breath sounds: Normal breath sounds. No wheezing or rhonchi.  Feet:     Comments: Avulsed great left green toe nail with white nail polish  Skin:    General: Skin is warm.  Neurological:     Mental Status: She is alert and oriented to person, place, and time.  Psychiatric:        Mood and Affect: Mood normal.        Behavior: Behavior normal.      UC Treatments / Results  Labs (all labs ordered are listed, but only abnormal results are displayed) Labs Reviewed - No data to display  EKG   Radiology No results found.  Procedures Nail Removal  Date/Time: 10/18/2024 1:28 PM  Performed by: Andra Corean BROCKS, PA-C Authorized by: Andra Corean BROCKS, PA-C   Consent:    Consent obtained:  Verbal   Consent given by:  Patient   Risks discussed:  Bleeding and pain   Alternatives discussed:  No treatment Universal protocol:    Procedure explained and questions answered to patient or proxy's satisfaction: yes     Patient identity confirmed:  Verbally with patient Pre-procedure details:    Skin preparation:  Chlorhexidine  with alcohol Procedure details:    Location:  Foot   Foot location:  L big toe Anesthesia:    Anesthesia method:  Nerve block   Block anesthetic:  Lidocaine  1% w/o epi   Block injection procedure:  Anatomic landmarks identified, anatomic landmarks palpated, introduced needle and incremental injection   Block outcome:  Anesthesia achieved Nail Removal:    Nail removed:  Complete   Nail bed repaired: no     Removed nail replaced and anchored: no   Post-procedure details:    Procedure completion:  Tolerated well, no immediate complications  (including critical care time)  Medications Ordered in UC Medications - No data to display  Initial Impression / Assessment and Plan / UC  Course  I have reviewed the triage vital signs and the nursing notes.  Pertinent labs & imaging results that were available during my care of the patient were  reviewed by me and considered in my medical decision making (see chart for details).    Final Clinical Impressions(s) / UC Diagnoses   Final diagnoses:  Nail avulsion of toe, initial encounter     Discharge Instructions      You have been diagnosed with an abrasion/laceration that requires wound care.  -Use you have the area clean and dry -Change your bandage at least daily, keep bandage clean and dry.  If bandages become soiled or wet they need to be changed soon as possible. -Will get wound daily, if you notice color discharge, foul odor, or pale/gray/black discoloration of skin, increased redness, pain, or swelling you need to seek attention immediately. -You may use ibuprofen and Tylenol  as needed for pain control.     ED Prescriptions   None    PDMP not reviewed this encounter.   Andra Corean BROCKS, PA-C 10/18/24 1329
